# Patient Record
Sex: Male | Born: 1935 | Race: White | Hispanic: No | Marital: Married | Smoking: Never smoker
Health system: Southern US, Community
[De-identification: ages and names within clinical notes are randomized; demographics above are authoritative.]

## PROBLEM LIST (undated history)

## (undated) DIAGNOSIS — I219 Acute myocardial infarction, unspecified: Secondary | ICD-10-CM

## (undated) DIAGNOSIS — I82409 Acute embolism and thrombosis of unspecified deep veins of unspecified lower extremity: Secondary | ICD-10-CM

## (undated) DIAGNOSIS — I639 Cerebral infarction, unspecified: Secondary | ICD-10-CM

## (undated) DIAGNOSIS — K279 Peptic ulcer, site unspecified, unspecified as acute or chronic, without hemorrhage or perforation: Secondary | ICD-10-CM

## (undated) DIAGNOSIS — I10 Essential (primary) hypertension: Secondary | ICD-10-CM

## (undated) DIAGNOSIS — Z86711 Personal history of pulmonary embolism: Secondary | ICD-10-CM

## (undated) DIAGNOSIS — D649 Anemia, unspecified: Secondary | ICD-10-CM

## (undated) DIAGNOSIS — E785 Hyperlipidemia, unspecified: Secondary | ICD-10-CM

## (undated) DIAGNOSIS — I509 Heart failure, unspecified: Secondary | ICD-10-CM

## (undated) HISTORY — DX: Anemia, unspecified: D64.9

## (undated) HISTORY — DX: Personal history of pulmonary embolism: Z86.711

## (undated) HISTORY — DX: Acute embolism and thrombosis of unspecified deep veins of unspecified lower extremity: I82.409

## (undated) HISTORY — DX: Peptic ulcer, site unspecified, unspecified as acute or chronic, without hemorrhage or perforation: K27.9

## (undated) HISTORY — DX: Heart failure, unspecified: I50.9

## (undated) HISTORY — DX: Cerebral infarction, unspecified: I63.9

## (undated) HISTORY — DX: Acute myocardial infarction, unspecified: I21.9

## (undated) HISTORY — PX: GASTRECTOMY: SHX58

## (undated) HISTORY — PX: TRANSLUMINAL ANGIOPLASTY: SHX274

## (undated) HISTORY — DX: Hyperlipidemia, unspecified: E78.5

## (undated) HISTORY — PX: CATARACT EXTRACTION: SUR2

## (undated) HISTORY — DX: Essential (primary) hypertension: I10

## (undated) HISTORY — PX: NEPHRECTOMY: SHX65

## (undated) HISTORY — PX: APPENDECTOMY: SHX54

---

## 1998-12-15 ENCOUNTER — Ambulatory Visit (HOSPITAL_COMMUNITY): Admission: RE | Admit: 1998-12-15 | Discharge: 1998-12-15 | Payer: Self-pay | Admitting: Neurology

## 1998-12-15 ENCOUNTER — Encounter: Payer: Self-pay | Admitting: Neurology

## 2001-05-02 ENCOUNTER — Encounter: Payer: Self-pay | Admitting: Internal Medicine

## 2001-05-16 ENCOUNTER — Encounter: Payer: Self-pay | Admitting: Anesthesiology

## 2001-05-16 ENCOUNTER — Ambulatory Visit (HOSPITAL_COMMUNITY): Admission: RE | Admit: 2001-05-16 | Discharge: 2001-05-16 | Payer: Self-pay | Admitting: Anesthesiology

## 2001-12-15 ENCOUNTER — Inpatient Hospital Stay (HOSPITAL_COMMUNITY): Admission: EM | Admit: 2001-12-15 | Discharge: 2001-12-19 | Payer: Self-pay | Admitting: Emergency Medicine

## 2001-12-15 ENCOUNTER — Encounter: Payer: Self-pay | Admitting: Emergency Medicine

## 2002-01-01 ENCOUNTER — Encounter: Payer: Self-pay | Admitting: Internal Medicine

## 2002-01-01 ENCOUNTER — Encounter: Admission: RE | Admit: 2002-01-01 | Discharge: 2002-01-01 | Payer: Self-pay | Admitting: Internal Medicine

## 2002-06-23 ENCOUNTER — Encounter: Payer: Self-pay | Admitting: Neurosurgery

## 2002-06-23 ENCOUNTER — Ambulatory Visit (HOSPITAL_COMMUNITY): Admission: RE | Admit: 2002-06-23 | Discharge: 2002-06-23 | Payer: Self-pay | Admitting: Neurosurgery

## 2002-07-06 ENCOUNTER — Encounter: Payer: Self-pay | Admitting: Emergency Medicine

## 2002-07-06 ENCOUNTER — Inpatient Hospital Stay (HOSPITAL_COMMUNITY): Admission: EM | Admit: 2002-07-06 | Discharge: 2002-07-13 | Payer: Self-pay | Admitting: Emergency Medicine

## 2002-07-13 ENCOUNTER — Encounter: Payer: Self-pay | Admitting: Internal Medicine

## 2002-07-22 ENCOUNTER — Ambulatory Visit (HOSPITAL_COMMUNITY)
Admission: RE | Admit: 2002-07-22 | Discharge: 2002-07-22 | Payer: Self-pay | Admitting: Physical Medicine & Rehabilitation

## 2002-09-17 ENCOUNTER — Encounter: Payer: Self-pay | Admitting: Diagnostic Radiology

## 2002-09-17 ENCOUNTER — Encounter: Admission: RE | Admit: 2002-09-17 | Discharge: 2002-09-17 | Payer: Self-pay | Admitting: Neurosurgery

## 2002-09-17 ENCOUNTER — Encounter: Payer: Self-pay | Admitting: Neurosurgery

## 2002-11-08 ENCOUNTER — Encounter: Admission: RE | Admit: 2002-11-08 | Discharge: 2002-11-08 | Payer: Self-pay | Admitting: Neurosurgery

## 2002-11-08 ENCOUNTER — Encounter: Payer: Self-pay | Admitting: Neurosurgery

## 2003-03-21 ENCOUNTER — Encounter: Admission: RE | Admit: 2003-03-21 | Discharge: 2003-03-21 | Payer: Self-pay | Admitting: Neurosurgery

## 2003-04-17 ENCOUNTER — Encounter: Admission: RE | Admit: 2003-04-17 | Discharge: 2003-05-23 | Payer: Self-pay | Admitting: Internal Medicine

## 2003-06-04 ENCOUNTER — Emergency Department (HOSPITAL_COMMUNITY): Admission: EM | Admit: 2003-06-04 | Discharge: 2003-06-04 | Payer: Self-pay | Admitting: Emergency Medicine

## 2003-08-17 ENCOUNTER — Ambulatory Visit (HOSPITAL_COMMUNITY): Admission: RE | Admit: 2003-08-17 | Discharge: 2003-08-17 | Payer: Self-pay | Admitting: Neurosurgery

## 2003-09-11 ENCOUNTER — Inpatient Hospital Stay (HOSPITAL_COMMUNITY): Admission: RE | Admit: 2003-09-11 | Discharge: 2003-09-19 | Payer: Self-pay | Admitting: Neurosurgery

## 2004-02-11 ENCOUNTER — Ambulatory Visit: Payer: Self-pay | Admitting: Internal Medicine

## 2004-03-10 ENCOUNTER — Ambulatory Visit: Payer: Self-pay | Admitting: Internal Medicine

## 2004-03-17 ENCOUNTER — Ambulatory Visit: Payer: Self-pay | Admitting: Internal Medicine

## 2004-04-08 ENCOUNTER — Ambulatory Visit: Payer: Self-pay | Admitting: Internal Medicine

## 2004-04-14 ENCOUNTER — Ambulatory Visit: Payer: Self-pay | Admitting: Internal Medicine

## 2004-04-20 ENCOUNTER — Ambulatory Visit: Payer: Self-pay

## 2004-04-21 ENCOUNTER — Ambulatory Visit: Payer: Self-pay | Admitting: Internal Medicine

## 2004-04-26 ENCOUNTER — Ambulatory Visit: Payer: Self-pay | Admitting: Internal Medicine

## 2004-04-28 ENCOUNTER — Ambulatory Visit: Payer: Self-pay | Admitting: Internal Medicine

## 2004-05-03 ENCOUNTER — Ambulatory Visit: Payer: Self-pay | Admitting: Internal Medicine

## 2004-05-31 ENCOUNTER — Ambulatory Visit: Payer: Self-pay | Admitting: Internal Medicine

## 2004-06-09 ENCOUNTER — Ambulatory Visit: Payer: Self-pay | Admitting: Cardiology

## 2004-06-10 IMAGING — CR DG SPINE 1V PORT
1 series · 1 of 1 positions shown · non-contrast
Comparison: none

CLINICAL DATA: Lumbar spondylosis and radiculopathy.  
 PORTABLE LUMBAR SPINE (TWO VIEWS)
 Intraoperative cross-table lateral radiograph number one shows posterior retractors with a probe overlying the posterior elements at the level of the L1-2 intervertebral disc space. 
 Intraoperative cross-table lateral radiograph number two shows probes overlying the posterior elements at the levels of the L1-2 and L2-3 intervertebral disc spaces. 
 IMPRESSION
 Intraoperative localization of levels of L1-2 and L2-3.

[view not recorded]
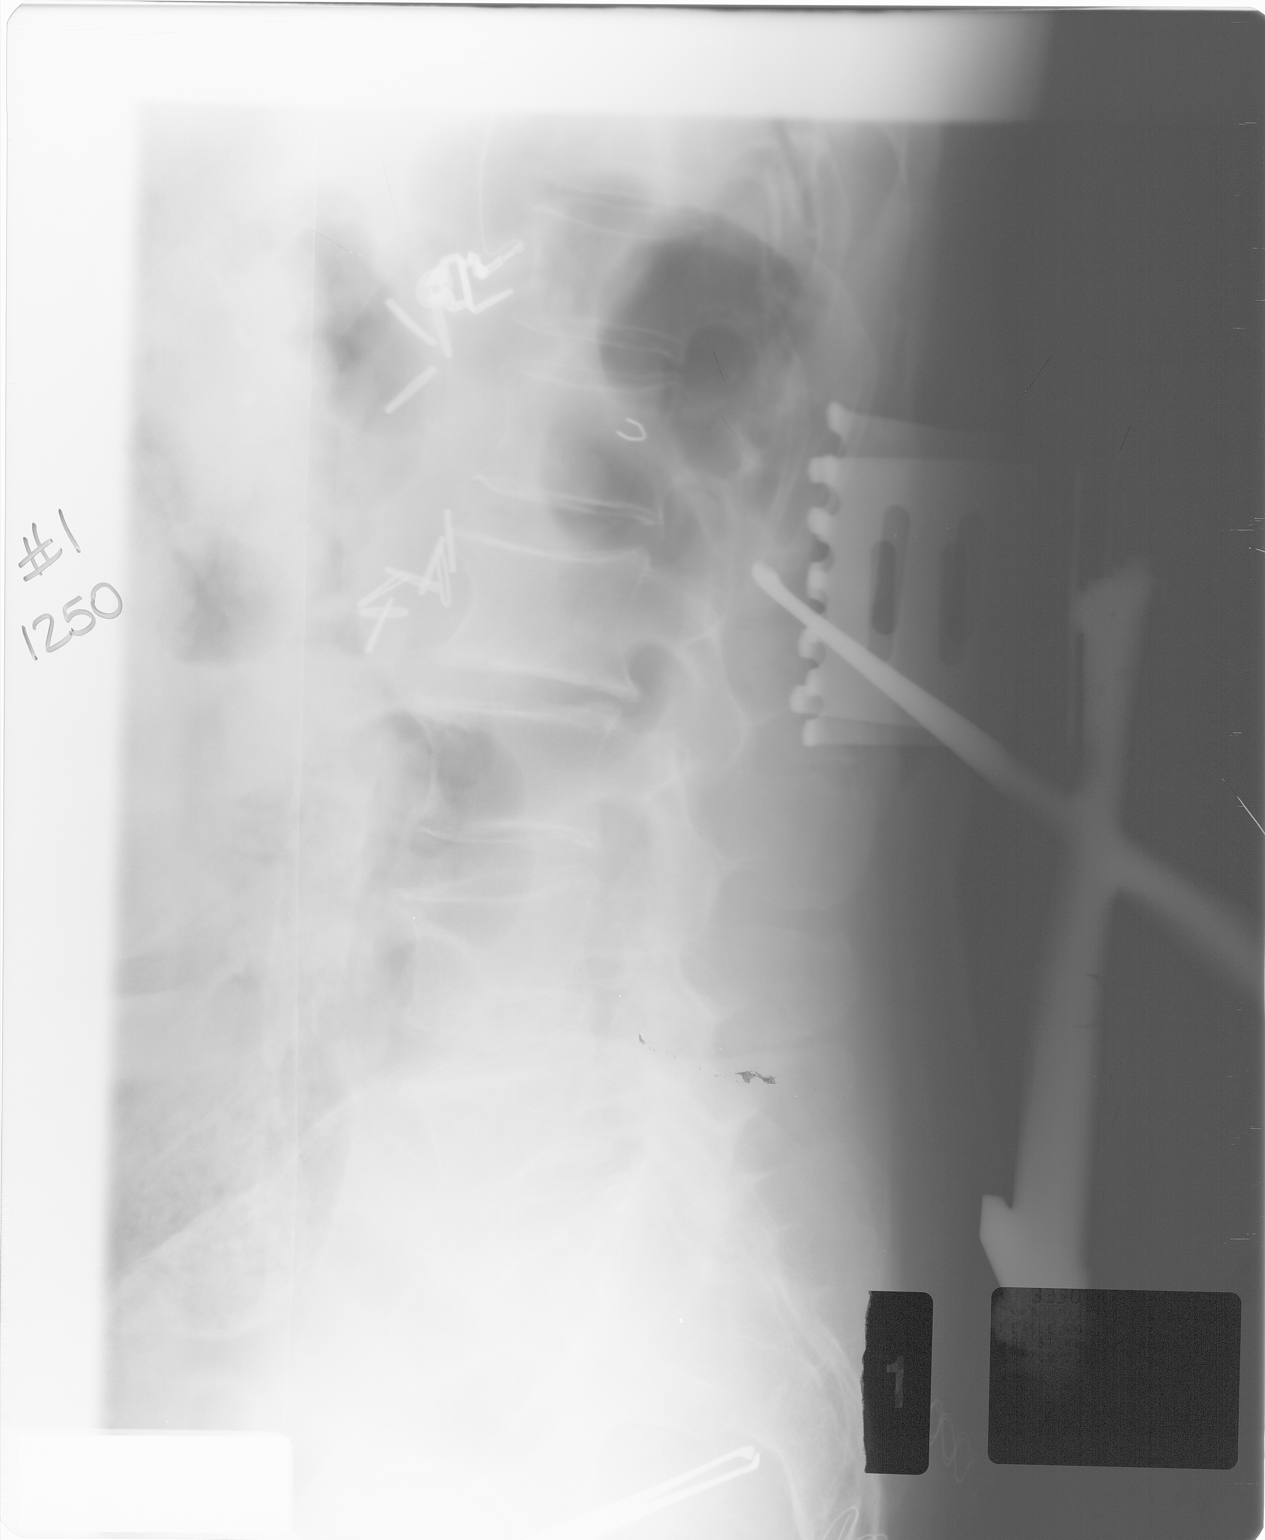

[1 of 1 positions shown; findings below may reference images not displayed]

## 2004-06-14 ENCOUNTER — Ambulatory Visit: Payer: Self-pay | Admitting: Internal Medicine

## 2004-06-17 ENCOUNTER — Ambulatory Visit: Payer: Self-pay

## 2004-06-25 ENCOUNTER — Ambulatory Visit: Payer: Self-pay | Admitting: Internal Medicine

## 2004-07-28 ENCOUNTER — Ambulatory Visit: Payer: Self-pay | Admitting: Internal Medicine

## 2004-07-29 ENCOUNTER — Ambulatory Visit: Payer: Self-pay | Admitting: Internal Medicine

## 2004-08-27 ENCOUNTER — Ambulatory Visit: Payer: Self-pay | Admitting: Internal Medicine

## 2004-10-01 ENCOUNTER — Ambulatory Visit: Payer: Self-pay | Admitting: Internal Medicine

## 2004-10-18 ENCOUNTER — Ambulatory Visit: Payer: Self-pay | Admitting: Internal Medicine

## 2004-11-17 ENCOUNTER — Ambulatory Visit: Payer: Self-pay | Admitting: Internal Medicine

## 2004-12-17 ENCOUNTER — Ambulatory Visit: Payer: Self-pay | Admitting: Internal Medicine

## 2005-01-17 ENCOUNTER — Ambulatory Visit: Payer: Self-pay | Admitting: Internal Medicine

## 2005-02-15 ENCOUNTER — Ambulatory Visit: Payer: Self-pay | Admitting: Internal Medicine

## 2005-03-14 ENCOUNTER — Ambulatory Visit (HOSPITAL_COMMUNITY): Admission: RE | Admit: 2005-03-14 | Discharge: 2005-03-14 | Payer: Self-pay | Admitting: Neurosurgery

## 2005-03-18 ENCOUNTER — Ambulatory Visit: Payer: Self-pay | Admitting: Internal Medicine

## 2005-03-21 ENCOUNTER — Ambulatory Visit (HOSPITAL_COMMUNITY): Admission: RE | Admit: 2005-03-21 | Discharge: 2005-03-21 | Payer: Self-pay | Admitting: Neurosurgery

## 2005-04-01 ENCOUNTER — Ambulatory Visit: Payer: Self-pay | Admitting: Internal Medicine

## 2005-05-03 ENCOUNTER — Ambulatory Visit: Payer: Self-pay | Admitting: Internal Medicine

## 2005-05-25 ENCOUNTER — Ambulatory Visit: Payer: Self-pay | Admitting: Internal Medicine

## 2005-06-21 ENCOUNTER — Ambulatory Visit: Payer: Self-pay | Admitting: Internal Medicine

## 2005-06-30 ENCOUNTER — Ambulatory Visit: Payer: Self-pay | Admitting: Cardiology

## 2005-07-26 ENCOUNTER — Ambulatory Visit: Payer: Self-pay | Admitting: Internal Medicine

## 2005-08-23 ENCOUNTER — Ambulatory Visit: Payer: Self-pay | Admitting: Internal Medicine

## 2005-09-23 ENCOUNTER — Ambulatory Visit: Payer: Self-pay | Admitting: Internal Medicine

## 2005-09-30 ENCOUNTER — Ambulatory Visit: Payer: Self-pay | Admitting: Internal Medicine

## 2005-10-14 ENCOUNTER — Ambulatory Visit: Payer: Self-pay | Admitting: Internal Medicine

## 2005-10-24 ENCOUNTER — Ambulatory Visit: Payer: Self-pay | Admitting: Internal Medicine

## 2005-11-24 ENCOUNTER — Ambulatory Visit: Payer: Self-pay | Admitting: Internal Medicine

## 2005-11-29 ENCOUNTER — Ambulatory Visit: Payer: Self-pay | Admitting: Internal Medicine

## 2005-12-30 ENCOUNTER — Ambulatory Visit: Payer: Self-pay | Admitting: Internal Medicine

## 2006-01-26 ENCOUNTER — Ambulatory Visit: Payer: Self-pay | Admitting: Internal Medicine

## 2006-02-24 ENCOUNTER — Ambulatory Visit: Payer: Self-pay | Admitting: Internal Medicine

## 2006-03-27 ENCOUNTER — Ambulatory Visit: Payer: Self-pay | Admitting: Internal Medicine

## 2006-04-21 ENCOUNTER — Ambulatory Visit: Payer: Self-pay | Admitting: Internal Medicine

## 2006-04-21 LAB — CONVERTED CEMR LAB
ALT: 20 units/L (ref 0–40)
AST: 22 units/L (ref 0–37)
Albumin: 3.7 g/dL (ref 3.5–5.2)
Alkaline Phosphatase: 88 units/L (ref 39–117)
BUN: 25 mg/dL — ABNORMAL HIGH (ref 6–23)
Basophils Absolute: 0 10*3/uL (ref 0.0–0.1)
Basophils Relative: 0.1 % (ref 0.0–1.0)
Bilirubin, Direct: 0.2 mg/dL (ref 0.0–0.3)
CO2: 26 meq/L (ref 19–32)
Calcium: 9.8 mg/dL (ref 8.4–10.5)
Chloride: 107 meq/L (ref 96–112)
Cholesterol: 142 mg/dL (ref 0–200)
Creatinine, Ser: 1.3 mg/dL (ref 0.4–1.5)
Eosinophils Relative: 1.3 % (ref 0.0–5.0)
GFR calc Af Amer: 67 mL/min
GFR calc non Af Amer: 56 mL/min
Glucose, Bld: 92 mg/dL (ref 70–99)
HCT: 34.6 % — ABNORMAL LOW (ref 39.0–52.0)
HDL: 37.1 mg/dL — ABNORMAL LOW (ref >39.0)
Hemoglobin: 10.9 g/dL — ABNORMAL LOW (ref 13.0–17.0)
LDL Cholesterol: 82 mg/dL (ref 0–99)
Lymphocytes Relative: 13.8 % (ref 12.0–46.0)
MCHC: 31.4 g/dL (ref 30.0–36.0)
MCV: 86.3 fL (ref 78.0–100.0)
Monocytes Absolute: 0.7 10*3/uL (ref 0.2–0.7)
Monocytes Relative: 10.6 % (ref 3.0–11.0)
Neutro Abs: 5 10*3/uL (ref 1.4–7.7)
Neutrophils Relative %: 74.2 % (ref 43.0–77.0)
Platelets: 302 10*3/uL (ref 150–400)
Potassium: 4.8 meq/L (ref 3.5–5.1)
RBC: 4.01 M/uL — ABNORMAL LOW (ref 4.22–5.81)
RDW: 17.5 % — ABNORMAL HIGH (ref 11.5–14.6)
Sodium: 139 meq/L (ref 135–145)
Total Bilirubin: 0.9 mg/dL (ref 0.3–1.2)
Total CHOL/HDL Ratio: 3.8
Total Protein: 6.6 g/dL (ref 6.0–8.3)
Triglycerides: 113 mg/dL (ref 0–149)
VLDL: 23 mg/dL (ref 0–40)
WBC: 6.7 10*3/uL (ref 4.5–10.5)

## 2006-04-26 ENCOUNTER — Ambulatory Visit: Payer: Self-pay | Admitting: Internal Medicine

## 2006-04-26 ENCOUNTER — Encounter: Admission: RE | Admit: 2006-04-26 | Discharge: 2006-05-15 | Payer: Self-pay | Admitting: Internal Medicine

## 2006-04-26 LAB — CONVERTED CEMR LAB
Basophils Absolute: 0 10*3/uL (ref 0.0–0.1)
Basophils Relative: 0.3 % (ref 0.0–1.0)
Eosinophils Absolute: 0.1 10*3/uL (ref 0.0–0.6)
Eosinophils Relative: 1.8 % (ref 0.0–5.0)
Ferritin: 7.8 ng/mL — ABNORMAL LOW (ref 22.0–322.0)
Folate: 13.4 ng/mL
HCT: 32.7 % — ABNORMAL LOW (ref 39.0–52.0)
Hemoglobin: 10.5 g/dL — ABNORMAL LOW (ref 13.0–17.0)
Iron: 33 ug/dL — ABNORMAL LOW (ref 42–165)
Lymphocytes Relative: 20.9 % (ref 12.0–46.0)
MCHC: 32.2 g/dL (ref 30.0–36.0)
MCV: 84.9 fL (ref 78.0–100.0)
Monocytes Absolute: 0.5 10*3/uL (ref 0.2–0.7)
Monocytes Relative: 8.4 % (ref 3.0–11.0)
Neutro Abs: 4.4 10*3/uL (ref 1.4–7.7)
Neutrophils Relative %: 68.6 % (ref 43.0–77.0)
Platelets: 221 10*3/uL (ref 150–400)
RBC: 3.85 M/uL — ABNORMAL LOW (ref 4.22–5.81)
RDW: 17.8 % — ABNORMAL HIGH (ref 11.5–14.6)
Saturation Ratios: 7.8 % — ABNORMAL LOW (ref 20.0–50.0)
Transferrin: 301.6 mg/dL (ref >=212.0)
Vitamin B-12: 407 pg/mL (ref 211–911)
WBC: 6.3 10*3/uL (ref 4.5–10.5)

## 2006-05-17 ENCOUNTER — Ambulatory Visit: Payer: Self-pay | Admitting: Gastroenterology

## 2006-05-19 ENCOUNTER — Ambulatory Visit: Payer: Self-pay | Admitting: Gastroenterology

## 2006-05-22 ENCOUNTER — Ambulatory Visit: Payer: Self-pay | Admitting: Internal Medicine

## 2006-05-25 ENCOUNTER — Ambulatory Visit: Payer: Self-pay | Admitting: Gastroenterology

## 2006-06-09 ENCOUNTER — Ambulatory Visit: Payer: Self-pay | Admitting: Gastroenterology

## 2006-06-13 ENCOUNTER — Ambulatory Visit: Payer: Self-pay | Admitting: Internal Medicine

## 2006-06-20 ENCOUNTER — Ambulatory Visit: Payer: Self-pay | Admitting: Internal Medicine

## 2006-06-26 ENCOUNTER — Ambulatory Visit: Payer: Self-pay | Admitting: Cardiology

## 2006-07-21 ENCOUNTER — Ambulatory Visit: Payer: Self-pay | Admitting: Internal Medicine

## 2006-07-21 LAB — CONVERTED CEMR LAB
ALT: 16 units/L (ref 0–40)
AST: 18 units/L (ref 0–37)
Albumin: 3.5 g/dL (ref 3.5–5.2)
Alkaline Phosphatase: 71 units/L (ref 39–117)
BUN: 23 mg/dL (ref 6–23)
Basophils Absolute: 0 10*3/uL (ref 0.0–0.1)
Basophils Relative: 0.2 % (ref 0.0–1.0)
Bilirubin, Direct: 0.1 mg/dL (ref 0.0–0.3)
CO2: 26 meq/L (ref 19–32)
Calcium: 9.3 mg/dL (ref 8.4–10.5)
Chloride: 111 meq/L (ref 96–112)
Cholesterol: 125 mg/dL (ref 0–200)
Creatinine, Ser: 1.3 mg/dL (ref 0.4–1.5)
Eosinophils Absolute: 0.2 10*3/uL (ref 0.0–0.6)
Eosinophils Relative: 3.5 % (ref 0.0–5.0)
Ferritin: 5.2 ng/mL — ABNORMAL LOW (ref 22.0–322.0)
GFR calc Af Amer: 67 mL/min
GFR calc non Af Amer: 56 mL/min
Glucose, Bld: 88 mg/dL (ref 70–99)
HCT: 23.8 % — CL (ref 39.0–52.0)
HDL: 40.9 mg/dL (ref >39.0)
Hemoglobin: 7.6 g/dL — CL (ref 13.0–17.0)
Iron: 16 ug/dL — ABNORMAL LOW (ref 42–165)
LDL Cholesterol: 67 mg/dL (ref 0–99)
Lymphocytes Relative: 18.4 % (ref 12.0–46.0)
MCHC: 31.9 g/dL (ref 30.0–36.0)
MCV: 82.2 fL (ref 78.0–100.0)
Monocytes Absolute: 0.6 10*3/uL (ref 0.2–0.7)
Monocytes Relative: 10.6 % (ref 3.0–11.0)
Neutro Abs: 3.5 10*3/uL (ref 1.4–7.7)
Neutrophils Relative %: 67.3 % (ref 43.0–77.0)
Platelets: 162 10*3/uL (ref 150–400)
Potassium: 5 meq/L (ref 3.5–5.1)
RBC: 2.9 M/uL — ABNORMAL LOW (ref 4.22–5.81)
RDW: 19.4 % — ABNORMAL HIGH (ref 11.5–14.6)
Saturation Ratios: 4.1 % — ABNORMAL LOW (ref 20.0–50.0)
Sodium: 143 meq/L (ref 135–145)
Total Bilirubin: 0.7 mg/dL (ref 0.3–1.2)
Total CHOL/HDL Ratio: 3.1
Total Protein: 6.2 g/dL (ref 6.0–8.3)
Transferrin: 276.3 mg/dL (ref >=212.0)
Triglycerides: 88 mg/dL (ref 0–149)
VLDL: 18 mg/dL (ref 0–40)
WBC: 5.3 10*3/uL (ref 4.5–10.5)

## 2006-07-24 ENCOUNTER — Encounter (HOSPITAL_COMMUNITY): Admission: RE | Admit: 2006-07-24 | Discharge: 2006-10-05 | Payer: Self-pay | Admitting: Internal Medicine

## 2006-08-17 ENCOUNTER — Ambulatory Visit: Payer: Self-pay | Admitting: Internal Medicine

## 2006-08-24 ENCOUNTER — Ambulatory Visit: Payer: Self-pay | Admitting: Internal Medicine

## 2006-08-24 DIAGNOSIS — I10 Essential (primary) hypertension: Secondary | ICD-10-CM | POA: Insufficient documentation

## 2006-08-24 DIAGNOSIS — E785 Hyperlipidemia, unspecified: Secondary | ICD-10-CM | POA: Insufficient documentation

## 2006-08-24 DIAGNOSIS — I252 Old myocardial infarction: Secondary | ICD-10-CM | POA: Insufficient documentation

## 2006-08-24 DIAGNOSIS — I2510 Atherosclerotic heart disease of native coronary artery without angina pectoris: Secondary | ICD-10-CM | POA: Insufficient documentation

## 2006-08-24 DIAGNOSIS — K279 Peptic ulcer, site unspecified, unspecified as acute or chronic, without hemorrhage or perforation: Secondary | ICD-10-CM | POA: Insufficient documentation

## 2006-08-24 LAB — CONVERTED CEMR LAB
Basophils Absolute: 0 10*3/uL (ref 0.0–0.1)
Basophils Relative: 0.2 % (ref 0.0–1.0)
Eosinophils Absolute: 0.2 10*3/uL (ref 0.0–0.6)
Eosinophils Relative: 2.2 % (ref 0.0–5.0)
Ferritin: 6.1 ng/mL — ABNORMAL LOW (ref 22.0–322.0)
HCT: 30.2 % — ABNORMAL LOW (ref 39.0–52.0)
Hemoglobin: 9.8 g/dL — ABNORMAL LOW (ref 13.0–17.0)
Iron: 23 ug/dL — ABNORMAL LOW (ref 42–165)
Lymphocytes Relative: 15.1 % (ref 12.0–46.0)
MCHC: 32.4 g/dL (ref 30.0–36.0)
MCV: 80.2 fL (ref 78.0–100.0)
Monocytes Absolute: 0.7 10*3/uL (ref 0.2–0.7)
Monocytes Relative: 9.8 % (ref 3.0–11.0)
Neutro Abs: 5 10*3/uL (ref 1.4–7.7)
Neutrophils Relative %: 72.7 % (ref 43.0–77.0)
Platelets: 169 10*3/uL (ref 150–400)
RBC: 3.77 M/uL — ABNORMAL LOW (ref 4.22–5.81)
RDW: 19.6 % — ABNORMAL HIGH (ref 11.5–14.6)
Saturation Ratios: 5.3 % — ABNORMAL LOW (ref 20.0–50.0)
Transferrin: 312.8 mg/dL (ref >=212.0)
WBC: 7 10*3/uL (ref 4.5–10.5)

## 2006-09-18 ENCOUNTER — Ambulatory Visit: Payer: Self-pay | Admitting: Internal Medicine

## 2006-10-03 ENCOUNTER — Ambulatory Visit: Payer: Self-pay | Admitting: Internal Medicine

## 2006-10-09 ENCOUNTER — Ambulatory Visit: Payer: Self-pay | Admitting: Internal Medicine

## 2006-10-10 ENCOUNTER — Ambulatory Visit: Payer: Self-pay | Admitting: Internal Medicine

## 2006-10-11 ENCOUNTER — Ambulatory Visit: Payer: Self-pay | Admitting: Internal Medicine

## 2006-10-11 LAB — CONVERTED CEMR LAB
ALT: 23 units/L (ref 0–53)
AST: 24 units/L (ref 0–37)
Albumin: 3.6 g/dL (ref 3.5–5.2)
Alkaline Phosphatase: 70 units/L (ref 39–117)
BUN: 19 mg/dL (ref 6–23)
Basophils Absolute: 0 10*3/uL (ref 0.0–0.1)
Basophils Relative: 0 % (ref 0–1)
Bilirubin, Direct: 0.1 mg/dL (ref 0.0–0.3)
CO2: 27 meq/L (ref 19–32)
Calcium: 9.9 mg/dL (ref 8.4–10.5)
Chloride: 112 meq/L (ref 96–112)
Cholesterol: 141 mg/dL (ref 0–200)
Creatinine, Ser: 1 mg/dL (ref 0.4–1.5)
Eosinophils Absolute: 0.1 10*3/uL (ref 0.0–0.7)
Eosinophils Relative: 2 % (ref 0–5)
Ferritin: 32.7 ng/mL (ref 22.0–322.0)
GFR calc Af Amer: 91 mL/min
GFR calc non Af Amer: 75 mL/min
Glucose, Bld: 94 mg/dL (ref 70–99)
HCT: 43.7 % (ref 39.0–52.0)
HDL: 41.7 mg/dL (ref >39.0)
Hemoglobin: 13.5 g/dL (ref 13.0–17.0)
Iron: 99 ug/dL (ref 42–165)
LDL Cholesterol: 72 mg/dL (ref 0–99)
Lymphocytes Relative: 15 % (ref 12–46)
Lymphs Abs: 1.1 10*3/uL (ref 0.7–3.3)
MCHC: 30.9 g/dL (ref 30.0–36.0)
MCV: 100.5 fL — ABNORMAL HIGH (ref 78.0–100.0)
Monocytes Absolute: 0.7 10*3/uL (ref 0.2–0.7)
Monocytes Relative: 10 % (ref 3–11)
Neutro Abs: 5.1 10*3/uL (ref 1.7–7.7)
Neutrophils Relative %: 73 % (ref 43–77)
Platelets: 126 10*3/uL — ABNORMAL LOW (ref 150–400)
Potassium: 4.9 meq/L (ref 3.5–5.1)
RBC: 4.35 M/uL (ref 4.22–5.81)
RDW: 23.7 % — ABNORMAL HIGH (ref 11.5–14.0)
Saturation Ratios: 30.5 % (ref 20.0–50.0)
Sodium: 140 meq/L (ref 135–145)
Total Bilirubin: 0.8 mg/dL (ref 0.3–1.2)
Total CHOL/HDL Ratio: 3.4
Total Protein: 6.3 g/dL (ref 6.0–8.3)
Transferrin: 231.9 mg/dL (ref >=212.0)
Triglycerides: 135 mg/dL (ref 0–149)
VLDL: 27 mg/dL (ref 0–40)
WBC: 6.9 10*3/uL (ref 4.0–10.5)

## 2006-10-23 ENCOUNTER — Ambulatory Visit: Payer: Self-pay | Admitting: Internal Medicine

## 2006-10-23 LAB — CONVERTED CEMR LAB: INR: 1.9

## 2006-11-23 ENCOUNTER — Ambulatory Visit: Payer: Self-pay | Admitting: Internal Medicine

## 2006-11-23 LAB — CONVERTED CEMR LAB
INR: 2
Prothrombin Time: 17.5 s

## 2006-12-25 ENCOUNTER — Ambulatory Visit: Payer: Self-pay | Admitting: Internal Medicine

## 2006-12-25 LAB — CONVERTED CEMR LAB
INR: 3.1
Prothrombin Time: 21.1 s

## 2007-01-03 ENCOUNTER — Ambulatory Visit: Payer: Self-pay | Admitting: Cardiology

## 2007-01-03 LAB — CONVERTED CEMR LAB
BUN: 23 mg/dL (ref 6–23)
CO2: 27 meq/L (ref 19–32)
Calcium: 9.8 mg/dL (ref 8.4–10.5)
Chloride: 108 meq/L (ref 96–112)
Creatinine, Ser: 1.2 mg/dL (ref 0.4–1.5)
GFR calc Af Amer: 74 mL/min
GFR calc non Af Amer: 61 mL/min
Glucose, Bld: 124 mg/dL — ABNORMAL HIGH (ref 70–99)
Potassium: 4.3 meq/L (ref 3.5–5.1)
Pro B Natriuretic peptide (BNP): 260 pg/mL — ABNORMAL HIGH (ref 0.0–100.0)
Sodium: 141 meq/L (ref 135–145)

## 2007-01-09 ENCOUNTER — Ambulatory Visit: Payer: Self-pay

## 2007-01-12 ENCOUNTER — Ambulatory Visit: Payer: Self-pay | Admitting: Internal Medicine

## 2007-01-12 DIAGNOSIS — D649 Anemia, unspecified: Secondary | ICD-10-CM | POA: Insufficient documentation

## 2007-01-16 LAB — CONVERTED CEMR LAB
Basophils Absolute: 0 10*3/uL (ref 0.0–0.1)
Basophils Relative: 0.3 % (ref 0.0–1.0)
Eosinophils Absolute: 0.1 10*3/uL (ref 0.0–0.6)
Eosinophils Relative: 1.6 % (ref 0.0–5.0)
Ferritin: 66.9 ng/mL (ref 22.0–322.0)
HCT: 41.7 % (ref 39.0–52.0)
Hemoglobin: 14.1 g/dL (ref 13.0–17.0)
Iron: 203 ug/dL — ABNORMAL HIGH (ref 42–165)
Lymphocytes Relative: 17.2 % (ref 12.0–46.0)
MCHC: 33.8 g/dL (ref 30.0–36.0)
MCV: 106.6 fL — ABNORMAL HIGH (ref 78.0–100.0)
Monocytes Absolute: 0.5 10*3/uL (ref 0.2–0.7)
Monocytes Relative: 8.3 % (ref 3.0–11.0)
Neutro Abs: 4.7 10*3/uL (ref 1.4–7.7)
Neutrophils Relative %: 72.6 % (ref 43.0–77.0)
Platelets: 133 10*3/uL — ABNORMAL LOW (ref 150–400)
RBC: 3.91 M/uL — ABNORMAL LOW (ref 4.22–5.81)
RDW: 14.6 % (ref 11.5–14.6)
Transferrin: 216.1 mg/dL (ref >=212.0)
WBC: 6.4 10*3/uL (ref 4.5–10.5)

## 2007-01-25 ENCOUNTER — Ambulatory Visit: Payer: Self-pay | Admitting: Cardiology

## 2007-01-25 LAB — CONVERTED CEMR LAB
BUN: 33 mg/dL — ABNORMAL HIGH (ref 6–23)
CO2: 29 meq/L (ref 19–32)
Calcium: 9.7 mg/dL (ref 8.4–10.5)
Chloride: 109 meq/L (ref 96–112)
Creatinine, Ser: 1.2 mg/dL (ref 0.4–1.5)
GFR calc Af Amer: 74 mL/min
GFR calc non Af Amer: 61 mL/min
Glucose, Bld: 85 mg/dL (ref 70–99)
INR: 3.4 — ABNORMAL HIGH (ref 0.8–1.0)
Potassium: 4.3 meq/L (ref 3.5–5.1)
Pro B Natriuretic peptide (BNP): 82 pg/mL (ref 0.0–100.0)
Prothrombin Time: 23.4 s — ABNORMAL HIGH (ref 10.9–13.3)
Sodium: 142 meq/L (ref 135–145)

## 2007-02-20 ENCOUNTER — Ambulatory Visit: Payer: Self-pay | Admitting: Cardiology

## 2007-03-06 ENCOUNTER — Ambulatory Visit: Payer: Self-pay | Admitting: Internal Medicine

## 2007-03-06 LAB — CONVERTED CEMR LAB
INR: 2.8
Prothrombin Time: 20.4 s

## 2007-03-23 ENCOUNTER — Emergency Department (HOSPITAL_COMMUNITY): Admission: EM | Admit: 2007-03-23 | Discharge: 2007-03-23 | Payer: Self-pay | Admitting: Emergency Medicine

## 2007-04-09 ENCOUNTER — Ambulatory Visit: Payer: Self-pay | Admitting: Internal Medicine

## 2007-04-09 LAB — CONVERTED CEMR LAB: INR: 3.2

## 2007-04-13 ENCOUNTER — Ambulatory Visit: Payer: Self-pay | Admitting: Internal Medicine

## 2007-04-13 LAB — CONVERTED CEMR LAB
Alkaline Phosphatase: 91 units/L (ref 39–117)
BUN: 28 mg/dL — ABNORMAL HIGH (ref 6–23)
Basophils Relative: 0.1 % (ref 0.0–1.0)
Bilirubin, Direct: 0.1 mg/dL (ref 0.0–0.3)
CO2: 29 meq/L (ref 19–32)
Cholesterol: 159 mg/dL (ref 0–200)
Creatinine, Ser: 1.2 mg/dL (ref 0.4–1.5)
GFR calc Af Amer: 74 mL/min
Glucose, Bld: 99 mg/dL (ref 70–99)
HCT: 44.8 % (ref 39.0–52.0)
HDL: 41 mg/dL (ref >39.0)
Hemoglobin: 15.1 g/dL (ref 13.0–17.0)
Lymphocytes Relative: 17.7 % (ref 12.0–46.0)
Monocytes Absolute: 0.6 10*3/uL (ref 0.2–0.7)
Monocytes Relative: 7.7 % (ref 3.0–11.0)
Neutro Abs: 5.4 10*3/uL (ref 1.4–7.7)
Neutrophils Relative %: 72.5 % (ref 43.0–77.0)
Potassium: 4.9 meq/L (ref 3.5–5.1)
RDW: 14.2 % (ref 11.5–14.6)
Sodium: 140 meq/L (ref 135–145)
Total Bilirubin: 1.1 mg/dL (ref 0.3–1.2)
Total Protein: 6.8 g/dL (ref 6.0–8.3)
VLDL: 27 mg/dL (ref 0–40)

## 2007-05-10 ENCOUNTER — Ambulatory Visit: Payer: Self-pay | Admitting: Internal Medicine

## 2007-05-10 LAB — CONVERTED CEMR LAB
INR: 4.1
Prothrombin Time: 24.6 s

## 2007-06-07 ENCOUNTER — Ambulatory Visit: Payer: Self-pay | Admitting: Internal Medicine

## 2007-06-07 LAB — CONVERTED CEMR LAB: INR: 4

## 2007-06-19 ENCOUNTER — Ambulatory Visit: Payer: Self-pay | Admitting: Cardiology

## 2007-07-03 ENCOUNTER — Encounter: Payer: Self-pay | Admitting: Cardiology

## 2007-07-03 ENCOUNTER — Ambulatory Visit: Payer: Self-pay | Admitting: Cardiology

## 2007-07-03 ENCOUNTER — Ambulatory Visit: Payer: Self-pay

## 2007-07-03 LAB — CONVERTED CEMR LAB
Basophils Absolute: 0 10*3/uL (ref 0.0–0.1)
Basophils Relative: 0.2 % (ref 0.0–1.0)
CO2: 29 meq/L (ref 19–32)
Chloride: 105 meq/L (ref 96–112)
Creatinine, Ser: 1.5 mg/dL (ref 0.4–1.5)
Eosinophils Absolute: 0.3 10*3/uL (ref 0.0–0.7)
GFR calc non Af Amer: 47 mL/min
Lymphocytes Relative: 17.5 % (ref 12.0–46.0)
MCHC: 32.5 g/dL (ref 30.0–36.0)
MCV: 107.8 fL — ABNORMAL HIGH (ref 78.0–100.0)
Neutrophils Relative %: 67.5 % (ref 43.0–77.0)
Platelets: 113 10*3/uL — ABNORMAL LOW (ref 150–400)
Pro B Natriuretic peptide (BNP): 48 pg/mL (ref 0.0–100.0)
RBC: 4.01 M/uL — ABNORMAL LOW (ref 4.22–5.81)
RDW: 14.7 % — ABNORMAL HIGH (ref 11.5–14.6)
Sodium: 139 meq/L (ref 135–145)

## 2007-07-09 ENCOUNTER — Ambulatory Visit: Payer: Self-pay | Admitting: Internal Medicine

## 2007-07-09 LAB — CONVERTED CEMR LAB: INR: 3.1

## 2007-07-24 ENCOUNTER — Ambulatory Visit (HOSPITAL_COMMUNITY): Admission: RE | Admit: 2007-07-24 | Discharge: 2007-07-24 | Payer: Self-pay | Admitting: Cardiology

## 2007-07-24 ENCOUNTER — Ambulatory Visit: Payer: Self-pay | Admitting: Cardiovascular Disease

## 2007-08-07 ENCOUNTER — Ambulatory Visit: Payer: Self-pay | Admitting: Internal Medicine

## 2007-08-07 LAB — CONVERTED CEMR LAB: INR: 1.9

## 2007-08-09 ENCOUNTER — Ambulatory Visit: Payer: Self-pay | Admitting: Vascular Surgery

## 2007-08-09 ENCOUNTER — Encounter (INDEPENDENT_AMBULATORY_CARE_PROVIDER_SITE_OTHER): Payer: Self-pay | Admitting: Emergency Medicine

## 2007-08-09 ENCOUNTER — Emergency Department (HOSPITAL_COMMUNITY): Admission: EM | Admit: 2007-08-09 | Discharge: 2007-08-09 | Payer: Self-pay | Admitting: Emergency Medicine

## 2007-08-15 ENCOUNTER — Telehealth: Payer: Self-pay | Admitting: Internal Medicine

## 2007-08-15 ENCOUNTER — Ambulatory Visit: Payer: Self-pay | Admitting: Internal Medicine

## 2007-09-04 ENCOUNTER — Ambulatory Visit: Payer: Self-pay | Admitting: Internal Medicine

## 2007-10-02 ENCOUNTER — Ambulatory Visit: Payer: Self-pay | Admitting: Internal Medicine

## 2007-10-02 LAB — CONVERTED CEMR LAB
INR: 2
Prothrombin Time: 17.4 s

## 2007-10-18 ENCOUNTER — Ambulatory Visit: Payer: Self-pay | Admitting: Cardiology

## 2007-10-18 LAB — CONVERTED CEMR LAB
CO2: 26 meq/L (ref 19–32)
Chloride: 102 meq/L (ref 96–112)
GFR calc Af Amer: 57 mL/min
Glucose, Bld: 96 mg/dL (ref 70–99)
Potassium: 4.5 meq/L (ref 3.5–5.1)
Sodium: 135 meq/L (ref 135–145)

## 2007-10-30 ENCOUNTER — Ambulatory Visit: Payer: Self-pay | Admitting: Internal Medicine

## 2007-10-30 LAB — CONVERTED CEMR LAB: Prothrombin Time: 18.8 s

## 2007-11-06 ENCOUNTER — Ambulatory Visit: Payer: Self-pay | Admitting: Internal Medicine

## 2007-11-28 ENCOUNTER — Ambulatory Visit: Payer: Self-pay | Admitting: Internal Medicine

## 2007-11-28 LAB — CONVERTED CEMR LAB: INR: 5.6

## 2007-12-12 ENCOUNTER — Ambulatory Visit: Payer: Self-pay | Admitting: Internal Medicine

## 2007-12-12 LAB — CONVERTED CEMR LAB: Prothrombin Time: 17.5 s

## 2007-12-20 ENCOUNTER — Encounter: Payer: Self-pay | Admitting: Internal Medicine

## 2008-01-11 ENCOUNTER — Ambulatory Visit: Payer: Self-pay | Admitting: Internal Medicine

## 2008-01-11 DIAGNOSIS — M549 Dorsalgia, unspecified: Secondary | ICD-10-CM | POA: Insufficient documentation

## 2008-01-11 LAB — CONVERTED CEMR LAB: INR: 2.6

## 2008-02-08 ENCOUNTER — Ambulatory Visit: Payer: Self-pay | Admitting: Internal Medicine

## 2008-03-05 ENCOUNTER — Encounter: Payer: Self-pay | Admitting: Internal Medicine

## 2008-03-11 ENCOUNTER — Encounter: Payer: Self-pay | Admitting: Internal Medicine

## 2008-03-12 ENCOUNTER — Ambulatory Visit: Payer: Self-pay | Admitting: Internal Medicine

## 2008-03-12 DIAGNOSIS — N259 Disorder resulting from impaired renal tubular function, unspecified: Secondary | ICD-10-CM | POA: Insufficient documentation

## 2008-03-12 LAB — CONVERTED CEMR LAB: Prothrombin Time: 22 s

## 2008-04-09 ENCOUNTER — Ambulatory Visit: Payer: Self-pay | Admitting: Internal Medicine

## 2008-04-09 LAB — CONVERTED CEMR LAB: INR: 3.1

## 2008-04-11 LAB — CONVERTED CEMR LAB
Basophils Absolute: 0 10*3/uL (ref 0.0–0.1)
Basophils Relative: 0.1 % (ref 0.0–3.0)
Calcium: 9.7 mg/dL (ref 8.4–10.5)
Cholesterol: 144 mg/dL (ref 0–200)
Creatinine, Ser: 1.4 mg/dL (ref 0.4–1.5)
Eosinophils Absolute: 0.1 10*3/uL (ref 0.0–0.7)
Ferritin: 38.9 ng/mL (ref 22.0–322.0)
GFR calc non Af Amer: 51 mL/min
Hemoglobin: 13.3 g/dL (ref 13.0–17.0)
Iron: 73 ug/dL (ref 42–165)
MCHC: 33.5 g/dL (ref 30.0–36.0)
MCV: 107.4 fL — ABNORMAL HIGH (ref 78.0–100.0)
Neutro Abs: 3.9 10*3/uL (ref 1.4–7.7)
Neutrophils Relative %: 75.6 % (ref 43.0–77.0)
RBC: 3.69 M/uL — ABNORMAL LOW (ref 4.22–5.81)
Total Bilirubin: 0.9 mg/dL (ref 0.3–1.2)
Transferrin: 216 mg/dL (ref >=212.0)
VLDL: 26 mg/dL (ref 0–40)
Vitamin B-12: 303 pg/mL (ref 211–911)

## 2008-05-12 ENCOUNTER — Ambulatory Visit: Payer: Self-pay | Admitting: Internal Medicine

## 2008-06-09 ENCOUNTER — Ambulatory Visit: Payer: Self-pay | Admitting: Internal Medicine

## 2008-06-09 LAB — CONVERTED CEMR LAB: Prothrombin Time: 16.4 s

## 2008-07-07 ENCOUNTER — Ambulatory Visit: Payer: Self-pay | Admitting: Internal Medicine

## 2008-07-07 LAB — CONVERTED CEMR LAB: INR: 3.1

## 2008-08-06 ENCOUNTER — Ambulatory Visit: Payer: Self-pay | Admitting: Internal Medicine

## 2008-08-06 LAB — CONVERTED CEMR LAB: Prothrombin Time: 23 s

## 2008-09-08 ENCOUNTER — Ambulatory Visit: Payer: Self-pay | Admitting: Internal Medicine

## 2008-09-08 LAB — CONVERTED CEMR LAB
INR: 2
Prothrombin Time: 17.3 s

## 2008-09-14 ENCOUNTER — Ambulatory Visit: Payer: Self-pay | Admitting: Cardiology

## 2008-09-14 ENCOUNTER — Inpatient Hospital Stay (HOSPITAL_COMMUNITY): Admission: EM | Admit: 2008-09-14 | Discharge: 2008-09-23 | Payer: Self-pay | Admitting: Emergency Medicine

## 2008-09-17 ENCOUNTER — Encounter: Payer: Self-pay | Admitting: Internal Medicine

## 2008-09-17 ENCOUNTER — Ambulatory Visit: Payer: Self-pay | Admitting: Vascular Surgery

## 2008-09-24 ENCOUNTER — Ambulatory Visit: Payer: Self-pay | Admitting: Cardiology

## 2008-09-25 ENCOUNTER — Encounter: Payer: Self-pay | Admitting: Internal Medicine

## 2008-09-25 ENCOUNTER — Telehealth: Payer: Self-pay | Admitting: Internal Medicine

## 2008-10-02 ENCOUNTER — Encounter: Payer: Self-pay | Admitting: Cardiology

## 2008-10-03 ENCOUNTER — Encounter: Payer: Self-pay | Admitting: Cardiology

## 2008-10-07 ENCOUNTER — Telehealth: Payer: Self-pay | Admitting: Internal Medicine

## 2008-10-07 ENCOUNTER — Ambulatory Visit: Payer: Self-pay | Admitting: Internal Medicine

## 2008-10-07 ENCOUNTER — Ambulatory Visit: Payer: Self-pay | Admitting: Cardiology

## 2008-10-14 ENCOUNTER — Telehealth: Payer: Self-pay | Admitting: Internal Medicine

## 2008-10-14 ENCOUNTER — Encounter: Payer: Self-pay | Admitting: Internal Medicine

## 2008-10-16 ENCOUNTER — Ambulatory Visit: Payer: Self-pay | Admitting: Family Medicine

## 2008-10-30 ENCOUNTER — Encounter (HOSPITAL_COMMUNITY): Admission: RE | Admit: 2008-10-30 | Discharge: 2009-01-01 | Payer: Self-pay | Admitting: Cardiology

## 2008-10-30 ENCOUNTER — Encounter: Payer: Self-pay | Admitting: Cardiology

## 2008-11-04 ENCOUNTER — Ambulatory Visit: Payer: Self-pay | Admitting: Internal Medicine

## 2008-11-04 LAB — CONVERTED CEMR LAB: INR: 1.6

## 2008-11-18 ENCOUNTER — Ambulatory Visit: Payer: Self-pay | Admitting: Internal Medicine

## 2008-11-18 LAB — CONVERTED CEMR LAB: INR: 2.2

## 2008-11-24 ENCOUNTER — Telehealth: Payer: Self-pay | Admitting: Internal Medicine

## 2008-12-02 ENCOUNTER — Encounter: Payer: Self-pay | Admitting: Internal Medicine

## 2008-12-02 ENCOUNTER — Encounter: Payer: Self-pay | Admitting: Cardiology

## 2008-12-10 ENCOUNTER — Encounter: Admission: RE | Admit: 2008-12-10 | Discharge: 2009-01-21 | Payer: Self-pay | Admitting: Internal Medicine

## 2008-12-16 ENCOUNTER — Ambulatory Visit: Payer: Self-pay | Admitting: Internal Medicine

## 2008-12-16 LAB — CONVERTED CEMR LAB
INR: 3.1
Prothrombin Time: 21.2 s

## 2008-12-27 ENCOUNTER — Encounter: Payer: Self-pay | Admitting: Internal Medicine

## 2009-01-05 ENCOUNTER — Encounter: Payer: Self-pay | Admitting: Internal Medicine

## 2009-01-09 ENCOUNTER — Telehealth: Payer: Self-pay | Admitting: Internal Medicine

## 2009-01-16 ENCOUNTER — Telehealth: Payer: Self-pay | Admitting: Internal Medicine

## 2009-01-16 ENCOUNTER — Ambulatory Visit: Payer: Self-pay | Admitting: Internal Medicine

## 2009-02-02 ENCOUNTER — Ambulatory Visit: Payer: Self-pay | Admitting: Cardiology

## 2009-02-12 ENCOUNTER — Ambulatory Visit: Payer: Self-pay | Admitting: Internal Medicine

## 2009-02-16 ENCOUNTER — Ambulatory Visit: Payer: Self-pay | Admitting: Family Medicine

## 2009-02-18 ENCOUNTER — Encounter: Payer: Self-pay | Admitting: Internal Medicine

## 2009-02-18 ENCOUNTER — Ambulatory Visit: Payer: Self-pay | Admitting: Family Medicine

## 2009-02-20 ENCOUNTER — Ambulatory Visit: Payer: Self-pay | Admitting: Internal Medicine

## 2009-02-20 LAB — CONVERTED CEMR LAB: Prothrombin Time: 18.2 s

## 2009-02-25 ENCOUNTER — Encounter (INDEPENDENT_AMBULATORY_CARE_PROVIDER_SITE_OTHER): Payer: Self-pay | Admitting: *Deleted

## 2009-03-24 ENCOUNTER — Ambulatory Visit: Payer: Self-pay | Admitting: Internal Medicine

## 2009-04-21 ENCOUNTER — Ambulatory Visit: Payer: Self-pay | Admitting: Internal Medicine

## 2009-04-21 LAB — CONVERTED CEMR LAB: Prothrombin Time: 16.8 s

## 2009-05-19 ENCOUNTER — Ambulatory Visit: Payer: Self-pay | Admitting: Internal Medicine

## 2009-05-19 LAB — CONVERTED CEMR LAB
INR: 2.1
Prothrombin Time: 17.9 s

## 2009-06-02 ENCOUNTER — Ambulatory Visit: Payer: Self-pay | Admitting: Cardiology

## 2009-06-23 ENCOUNTER — Ambulatory Visit: Payer: Self-pay | Admitting: Internal Medicine

## 2009-06-23 LAB — CONVERTED CEMR LAB: Prothrombin Time: 17.8 s

## 2009-06-24 LAB — CONVERTED CEMR LAB
AST: 26 units/L (ref 0–37)
BUN: 28 mg/dL — ABNORMAL HIGH (ref 6–23)
Basophils Absolute: 0 10*3/uL (ref 0.0–0.1)
Bilirubin, Direct: 0.2 mg/dL (ref 0.0–0.3)
CO2: 26 meq/L (ref 19–32)
Calcium: 9.4 mg/dL (ref 8.4–10.5)
Creatinine, Ser: 1.8 mg/dL — ABNORMAL HIGH (ref 0.4–1.5)
Eosinophils Relative: 1.5 % (ref 0.0–5.0)
Glucose, Bld: 80 mg/dL (ref 70–99)
HCT: 36.2 % — ABNORMAL LOW (ref 39.0–52.0)
HDL: 47.9 mg/dL (ref >39.00)
Hemoglobin: 11.9 g/dL — ABNORMAL LOW (ref 13.0–17.0)
Lymphs Abs: 1.1 10*3/uL (ref 0.7–4.0)
MCV: 102.5 fL — ABNORMAL HIGH (ref 78.0–100.0)
Monocytes Absolute: 0.6 10*3/uL (ref 0.1–1.0)
Monocytes Relative: 9.3 % (ref 3.0–12.0)
Neutro Abs: 4.5 10*3/uL (ref 1.4–7.7)
RDW: 13.9 % (ref 11.5–14.6)
Sodium: 139 meq/L (ref 135–145)
TSH: 1.35 microintl units/mL (ref 0.35–5.50)
Total Bilirubin: 0.8 mg/dL (ref 0.3–1.2)
Total CHOL/HDL Ratio: 3

## 2009-06-29 LAB — CONVERTED CEMR LAB
Saturation Ratios: 14.4 % — ABNORMAL LOW (ref 20.0–50.0)
Vitamin B-12: 551 pg/mL (ref 211–911)

## 2009-07-21 ENCOUNTER — Ambulatory Visit: Payer: Self-pay | Admitting: Internal Medicine

## 2009-07-21 LAB — CONVERTED CEMR LAB: INR: 1.9

## 2009-07-30 ENCOUNTER — Ambulatory Visit: Payer: Self-pay | Admitting: Internal Medicine

## 2009-07-30 LAB — CONVERTED CEMR LAB
Albumin ELP: 57.3 % (ref 55.8–66.1)
Alpha-1-Globulin: 6.2 % — ABNORMAL HIGH (ref 2.9–4.9)
Alpha-2-Globulin: 9.5 % (ref 7.1–11.8)
Beta Globulin: 5.7 % (ref 4.7–7.2)
Eosinophils Relative: 1.1 % (ref 0.0–5.0)
Lymphocytes Relative: 13.2 % (ref 12.0–46.0)
Monocytes Relative: 7.9 % (ref 3.0–12.0)
Neutrophils Relative %: 77.5 % — ABNORMAL HIGH (ref 43.0–77.0)
Platelets: 125 10*3/uL — ABNORMAL LOW (ref 150.0–400.0)
Total Protein, Serum Electrophoresis: 6.9 g/dL (ref 6.0–8.3)
WBC: 7.4 10*3/uL (ref 4.5–10.5)

## 2009-08-04 ENCOUNTER — Ambulatory Visit: Payer: Self-pay | Admitting: Internal Medicine

## 2009-08-04 LAB — CONVERTED CEMR LAB: INR: 1.4

## 2009-08-18 ENCOUNTER — Ambulatory Visit: Payer: Self-pay | Admitting: Internal Medicine

## 2009-08-27 ENCOUNTER — Emergency Department (HOSPITAL_COMMUNITY): Admission: EM | Admit: 2009-08-27 | Discharge: 2009-08-27 | Payer: Self-pay | Admitting: Emergency Medicine

## 2009-09-03 ENCOUNTER — Ambulatory Visit: Payer: Self-pay | Admitting: Internal Medicine

## 2009-09-03 LAB — CONVERTED CEMR LAB
Bilirubin Urine: NEGATIVE
Blood in Urine, dipstick: NEGATIVE
Glucose, Urine, Semiquant: NEGATIVE
pH: 5

## 2009-09-07 LAB — CONVERTED CEMR LAB
ALT: 21 units/L (ref 0–53)
AST: 23 units/L (ref 0–37)
Albumin: 3.8 g/dL (ref 3.5–5.2)
Alkaline Phosphatase: 95 units/L (ref 39–117)
Basophils Relative: 0.2 % (ref 0.0–3.0)
CO2: 26 meq/L (ref 19–32)
Calcium: 9.6 mg/dL (ref 8.4–10.5)
Eosinophils Absolute: 0.1 10*3/uL (ref 0.0–0.7)
Eosinophils Relative: 1.1 % (ref 0.0–5.0)
Hemoglobin: 14.8 g/dL (ref 13.0–17.0)
Lymphocytes Relative: 12.5 % (ref 12.0–46.0)
MCHC: 34.1 g/dL (ref 30.0–36.0)
Monocytes Relative: 6.6 % (ref 3.0–12.0)
Neutro Abs: 5.5 10*3/uL (ref 1.4–7.7)
RBC: 4.19 M/uL — ABNORMAL LOW (ref 4.22–5.81)
Sodium: 143 meq/L (ref 135–145)
Total Protein: 6.4 g/dL (ref 6.0–8.3)

## 2009-09-18 ENCOUNTER — Ambulatory Visit: Payer: Self-pay | Admitting: Internal Medicine

## 2009-09-18 LAB — CONVERTED CEMR LAB: INR: 2.2

## 2009-10-07 ENCOUNTER — Ambulatory Visit: Payer: Self-pay | Admitting: Vascular Surgery

## 2009-10-07 ENCOUNTER — Observation Stay (HOSPITAL_COMMUNITY): Admission: EM | Admit: 2009-10-07 | Discharge: 2009-10-09 | Payer: Self-pay | Admitting: Emergency Medicine

## 2009-10-07 ENCOUNTER — Encounter (INDEPENDENT_AMBULATORY_CARE_PROVIDER_SITE_OTHER): Payer: Self-pay | Admitting: Emergency Medicine

## 2009-10-08 ENCOUNTER — Ambulatory Visit: Payer: Self-pay | Admitting: Oncology

## 2009-10-08 ENCOUNTER — Encounter: Payer: Self-pay | Admitting: Cardiology

## 2009-10-12 ENCOUNTER — Ambulatory Visit: Payer: Self-pay | Admitting: Internal Medicine

## 2009-10-12 LAB — CONVERTED CEMR LAB: INR: 1.6

## 2009-10-19 ENCOUNTER — Ambulatory Visit: Payer: Self-pay | Admitting: Internal Medicine

## 2009-10-19 DIAGNOSIS — I82409 Acute embolism and thrombosis of unspecified deep veins of unspecified lower extremity: Secondary | ICD-10-CM | POA: Insufficient documentation

## 2009-10-28 ENCOUNTER — Ambulatory Visit: Payer: Self-pay | Admitting: Internal Medicine

## 2009-11-12 ENCOUNTER — Ambulatory Visit: Payer: Self-pay | Admitting: Internal Medicine

## 2009-12-10 ENCOUNTER — Ambulatory Visit: Payer: Self-pay | Admitting: Internal Medicine

## 2009-12-23 ENCOUNTER — Ambulatory Visit: Payer: Self-pay | Admitting: Cardiology

## 2010-01-07 ENCOUNTER — Ambulatory Visit: Payer: Self-pay | Admitting: Internal Medicine

## 2010-01-07 LAB — CONVERTED CEMR LAB: INR: 1.6

## 2010-01-22 ENCOUNTER — Ambulatory Visit: Payer: Self-pay | Admitting: Internal Medicine

## 2010-02-05 ENCOUNTER — Ambulatory Visit: Payer: Self-pay | Admitting: Internal Medicine

## 2010-02-19 ENCOUNTER — Ambulatory Visit: Payer: Self-pay | Admitting: Internal Medicine

## 2010-02-19 LAB — CONVERTED CEMR LAB: INR: 2.3

## 2010-03-01 ENCOUNTER — Ambulatory Visit: Payer: Self-pay | Admitting: Internal Medicine

## 2010-03-02 LAB — CONVERTED CEMR LAB
Albumin: 4 g/dL (ref 3.5–5.2)
Alkaline Phosphatase: 96 units/L (ref 39–117)
Basophils Absolute: 0 10*3/uL (ref 0.0–0.1)
Basophils Relative: 0.3 % (ref 0.0–3.0)
CO2: 26 meq/L (ref 19–32)
Calcium: 9.8 mg/dL (ref 8.4–10.5)
Chloride: 108 meq/L (ref 96–112)
Cholesterol: 159 mg/dL (ref 0–200)
Eosinophils Absolute: 0.1 10*3/uL (ref 0.0–0.7)
Glucose, Bld: 90 mg/dL (ref 70–99)
HCT: 44.9 % (ref 39.0–52.0)
HDL: 44.5 mg/dL (ref >39.00)
Hemoglobin: 15.1 g/dL (ref 13.0–17.0)
Lymphocytes Relative: 16.5 % (ref 12.0–46.0)
Lymphs Abs: 1 10*3/uL (ref 0.7–4.0)
MCHC: 33.7 g/dL (ref 30.0–36.0)
MCV: 105 fL — ABNORMAL HIGH (ref 78.0–100.0)
Neutro Abs: 4.6 10*3/uL (ref 1.4–7.7)
Potassium: 4.7 meq/L (ref 3.5–5.1)
RBC: 4.27 M/uL (ref 4.22–5.81)
RDW: 15.1 % — ABNORMAL HIGH (ref 11.5–14.6)
Sodium: 142 meq/L (ref 135–145)
TSH: 1.39 microintl units/mL (ref 0.35–5.50)
Total Protein: 6.7 g/dL (ref 6.0–8.3)
VLDL: 30.8 mg/dL (ref 0.0–40.0)

## 2010-03-19 ENCOUNTER — Ambulatory Visit: Payer: Self-pay | Admitting: Internal Medicine

## 2010-04-01 ENCOUNTER — Ambulatory Visit: Payer: Self-pay | Admitting: Internal Medicine

## 2010-04-01 LAB — CONVERTED CEMR LAB
Basophils Absolute: 0 10*3/uL (ref 0.0–0.1)
Eosinophils Absolute: 0.1 10*3/uL (ref 0.0–0.7)
HCT: 43.4 % (ref 39.0–52.0)
INR: 2.3
Lymphs Abs: 1 10*3/uL (ref 0.7–4.0)
MCV: 105.5 fL — ABNORMAL HIGH (ref 78.0–100.0)
Monocytes Absolute: 0.6 10*3/uL (ref 0.1–1.0)
Monocytes Relative: 8.4 % (ref 3.0–12.0)
Platelets: 108 10*3/uL — ABNORMAL LOW (ref 150.0–400.0)
RDW: 15.1 % — ABNORMAL HIGH (ref 11.5–14.6)

## 2010-04-08 ENCOUNTER — Inpatient Hospital Stay (HOSPITAL_COMMUNITY)
Admission: EM | Admit: 2010-04-08 | Discharge: 2010-04-10 | Payer: Self-pay | Source: Home / Self Care | Attending: Cardiovascular Disease | Admitting: Cardiovascular Disease

## 2010-04-08 LAB — TROPONIN I
Troponin I: 0.02 ng/mL (ref 0.00–0.06)
Troponin I: 0.03 ng/mL (ref 0.00–0.06)

## 2010-04-08 LAB — BASIC METABOLIC PANEL
BUN: 24 mg/dL — ABNORMAL HIGH (ref 6–23)
CO2: 26 mEq/L (ref 19–32)
Calcium: 9.5 mg/dL (ref 8.4–10.5)
Chloride: 109 mEq/L (ref 96–112)
Creatinine, Ser: 1.35 mg/dL (ref 0.4–1.5)
GFR calc Af Amer: >60 mL/min (ref >60)
GFR calc non Af Amer: 50 mL/min — ABNORMAL LOW (ref >60)
Glucose, Bld: 113 mg/dL — ABNORMAL HIGH (ref 70–99)
Potassium: 4.2 mEq/L (ref 3.5–5.1)
Sodium: 140 mEq/L (ref 135–145)

## 2010-04-08 LAB — PROTIME-INR
INR: 2.13 — ABNORMAL HIGH (ref 0.00–1.49)
Prothrombin Time: 24 seconds — ABNORMAL HIGH (ref 11.6–15.2)

## 2010-04-08 LAB — CARDIAC PANEL(CRET KIN+CKTOT+MB+TROPI)
CK, MB: 2 ng/mL (ref 0.3–4.0)
Relative Index: INVALID (ref 0.0–2.5)
Total CK: 39 U/L (ref 7–232)
Troponin I: 0.02 ng/mL (ref 0.00–0.06)

## 2010-04-08 LAB — CBC
HCT: 41.4 % (ref 39.0–52.0)
Hemoglobin: 14 g/dL (ref 13.0–17.0)
MCH: 33.7 pg (ref 26.0–34.0)
MCHC: 33.8 g/dL (ref 30.0–36.0)
MCV: 99.5 fL (ref 78.0–100.0)
Platelets: 105 10*3/uL — ABNORMAL LOW (ref 150–400)
RBC: 4.16 MIL/uL — ABNORMAL LOW (ref 4.22–5.81)
RDW: 13.5 % (ref 11.5–15.5)
WBC: 8.2 10*3/uL (ref 4.0–10.5)

## 2010-04-08 LAB — URINALYSIS, ROUTINE W REFLEX MICROSCOPIC
Bilirubin Urine: NEGATIVE
Hemoglobin, Urine: NEGATIVE
Ketones, ur: NEGATIVE mg/dL
Nitrite: NEGATIVE
Protein, ur: 30 mg/dL — AB
Specific Gravity, Urine: 1.017 (ref 1.005–1.030)
Urine Glucose, Fasting: NEGATIVE mg/dL
Urobilinogen, UA: 0.2 mg/dL (ref 0.0–1.0)
pH: 5 (ref 5.0–8.0)

## 2010-04-08 LAB — DIFFERENTIAL
Basophils Absolute: 0 10*3/uL (ref 0.0–0.1)
Basophils Relative: 0 % (ref 0–1)
Eosinophils Absolute: 0.2 10*3/uL (ref 0.0–0.7)
Eosinophils Relative: 2 % (ref 0–5)
Lymphocytes Relative: 12 % (ref 12–46)
Lymphs Abs: 1 10*3/uL (ref 0.7–4.0)
Monocytes Absolute: 0.7 10*3/uL (ref 0.1–1.0)
Monocytes Relative: 9 % (ref 3–12)
Neutro Abs: 6.3 10*3/uL (ref 1.7–7.7)
Neutrophils Relative %: 77 % (ref 43–77)

## 2010-04-08 LAB — CK TOTAL AND CKMB (NOT AT ARMC)
CK, MB: 2.8 ng/mL (ref 0.3–4.0)
CK, MB: 2.3 ng/mL (ref 0.3–4.0)
Relative Index: INVALID (ref 0.0–2.5)
Relative Index: INVALID (ref 0.0–2.5)
Total CK: 46 U/L (ref 7–232)
Total CK: 38 U/L (ref 7–232)

## 2010-04-08 LAB — URINE MICROSCOPIC-ADD ON

## 2010-04-08 LAB — TSH: TSH: 1.234 u[IU]/mL (ref 0.350–4.500)

## 2010-04-09 ENCOUNTER — Encounter: Payer: Self-pay | Admitting: Cardiology

## 2010-04-09 LAB — LIPID PANEL
Cholesterol: 130 mg/dL (ref 0–200)
HDL: 43 mg/dL (ref >39)
LDL Cholesterol: 64 mg/dL (ref 0–99)
Total CHOL/HDL Ratio: 3 RATIO
Triglycerides: 115 mg/dL (ref <150)
VLDL: 23 mg/dL (ref 0–40)

## 2010-04-09 LAB — PROTIME-INR
INR: 2.13 — ABNORMAL HIGH (ref 0.00–1.49)
Prothrombin Time: 24 seconds — ABNORMAL HIGH (ref 11.6–15.2)

## 2010-04-09 LAB — CARDIAC PANEL(CRET KIN+CKTOT+MB+TROPI)
CK, MB: 2.1 ng/mL (ref 0.3–4.0)
Relative Index: INVALID (ref 0.0–2.5)
Total CK: 39 U/L (ref 7–232)
Troponin I: 0.02 ng/mL (ref 0.00–0.06)

## 2010-04-12 ENCOUNTER — Ambulatory Visit: Admission: RE | Admit: 2010-04-12 | Payer: Self-pay | Source: Home / Self Care | Admitting: Cardiology

## 2010-04-13 ENCOUNTER — Ambulatory Visit
Admission: RE | Admit: 2010-04-13 | Discharge: 2010-04-13 | Payer: Self-pay | Source: Home / Self Care | Attending: Internal Medicine | Admitting: Internal Medicine

## 2010-04-13 LAB — CONVERTED CEMR LAB: INR: 1.8

## 2010-04-19 LAB — BASIC METABOLIC PANEL
BUN: 29 mg/dL — ABNORMAL HIGH (ref 6–23)
CO2: 27 mEq/L (ref 19–32)
Calcium: 9.6 mg/dL (ref 8.4–10.5)
Chloride: 106 mEq/L (ref 96–112)
Creatinine, Ser: 1.39 mg/dL (ref 0.4–1.5)
GFR calc Af Amer: 58 mL/min — ABNORMAL LOW (ref >60)
GFR calc non Af Amer: 48 mL/min — ABNORMAL LOW (ref >60)
Glucose, Bld: 92 mg/dL (ref 70–99)
Potassium: 4.8 mEq/L (ref 3.5–5.1)
Sodium: 140 mEq/L (ref 135–145)

## 2010-04-19 LAB — PROTIME-INR
INR: 1.74 — ABNORMAL HIGH (ref 0.00–1.49)
Prothrombin Time: 20.5 seconds — ABNORMAL HIGH (ref 11.6–15.2)

## 2010-04-19 LAB — CBC
HCT: 42.9 % (ref 39.0–52.0)
Hemoglobin: 14.4 g/dL (ref 13.0–17.0)
MCH: 33.7 pg (ref 26.0–34.0)
MCHC: 33.6 g/dL (ref 30.0–36.0)
MCV: 100.5 fL — ABNORMAL HIGH (ref 78.0–100.0)
Platelets: 97 10*3/uL — ABNORMAL LOW (ref 150–400)
RBC: 4.27 MIL/uL (ref 4.22–5.81)
RDW: 13.6 % (ref 11.5–15.5)
WBC: 5.6 10*3/uL (ref 4.0–10.5)

## 2010-04-19 LAB — APTT: aPTT: 30 seconds (ref 24–37)

## 2010-04-20 ENCOUNTER — Ambulatory Visit: Admit: 2010-04-20 | Payer: Self-pay | Admitting: Internal Medicine

## 2010-04-21 ENCOUNTER — Other Ambulatory Visit: Payer: Self-pay | Admitting: Cardiovascular Disease

## 2010-04-21 ENCOUNTER — Ambulatory Visit
Admission: RE | Admit: 2010-04-21 | Discharge: 2010-04-21 | Payer: Self-pay | Source: Home / Self Care | Attending: Cardiovascular Disease | Admitting: Cardiovascular Disease

## 2010-04-21 LAB — PROTIME-INR
INR: 2.5 ratio — ABNORMAL HIGH (ref 0.8–1.0)
Prothrombin Time: 25.9 s — ABNORMAL HIGH (ref 10.2–12.4)

## 2010-04-23 NOTE — Consult Note (Signed)
NAMEDERIC, BOCOCK                ACCOUNT NO.:  [id number]  MEDICAL RECORD NO.:  [id number]          PATIENT TYPE:  INP  LOCATION:  3730                         FACILITY:  [facility]  PHYSICIAN:  Jesse Sans. Wall, MD, FACCDATE OF BIRTH:  03/12/1920  DATE OF CONSULTATION:  04/08/2010 DATE OF DISCHARGE:                                CONSULTATION   PRIMARY CARDIOLOGIST:  Formerly Dr. Charlies Constable and in the future will be followed by Dr. Verne Carrow.  PRIMARY CARE PHYSICIAN:  Valetta Mole. Swords, MD  REASON FOR CONSULTATION:  Chest discomfort.  HISTORY OF PRESENT ILLNESS:  Mr. Salinger is a very pleasant 77 year old Caucasian male with a known history of coronary artery disease dating back to 71.  At that time, he had an anterior wall MI with t-PA and subsequent PTCA of the LAD, then had a stent placed in the right coronary artery in 1995.  Most recently in 2010, the patient had a 2.5 x 28-mm Xience drug-eluting stent placed to the second obtuse marginal. 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  The patient was in his usual state of health until approximately 3 a.m. the morning when he was just dozing, but became aware of substernal chest pressure that was fairly severe, 8/10 in severity, there was not resolving, and he took sublingual nitroglycerin.  After his first sublingual nitroglycerin, there was no relief and so after 5 minutes he took another one and then a third sublingual nitroglycerin after another 5 minutes.  5 minutes after his third sublingual nitroglycerin, his chest discomfort resolved and has not returned.  However, EMS was called and brought him to [facility] ED for further evaluation.  Here in the emergency  department, EKG did not show any concerning changes prior to an old tracing from June 2010 and his first only set of cardiac markers are within normal limits.  No other significant findings in labs other than INR therapeutic at 2.14.  Chest x-ray shows no acute pulmonary process.  Vital signs within normal limits and stable.  Currently, he has no complaints.  Note, the patient and daughter report 2 recent mechanical falls without any trauma.  PAST MEDICAL HISTORY: 1. CAD.     a.     Anterior MI with t-PA and PTCA to the LAD, 1987.     b.     PCI and stent to RCA, 1995.     c.     2.5 x 28 mm Xience DES to OM-2, 2010 (single vessel disease      only noted on cath at that time). 2. History of DVT/PE (protein S deficiency).     a.     Chronically anticoagulated on Coumadin. 3. Hypertension. 4. Hyperlipidemia. 5. Anemia (negative GI workup). 6. Ischemic cardiomyopathy.     a.     2-D echocardiogram March 2009:  LVEF 45% with akinesis of      the  periapical wall and abnormal LV relaxation as well as mild LV      dilatation and left atrium mildly dilated. 7. History of TIAs. 8. Gout. 9. History of thrombophlebitis July 2011.  SOCIAL HISTORY:  The patient lives in [place] with his wife.  He is retired Management consultant, also worked in Technical sales engineer previously.  He has no smoking or significant EtOH history.  No illicit drugs.  No herbal meds. Regular diet.  No regular exercise, but is active and independent in terms of daily activities.  FAMILY HISTORY:  Noncontributory secondary to the patient's advanced age.  REVIEW OF SYSTEMS:  Please see HPI.  All other systems were reviewed and were negative.  Note, the patient denies any orthopnea, PND, lower extremity edema, dyspnea on exertion, palpitations, presyncope, did report some nausea with symptoms in HPI, but no vomiting.  No recent fevers, chills, or significant cough (minimal cough recently nonproductive).  CODE STATUS:   Full.  ALLERGIES AND INTOLERANCES: 1. MORPHINE SULFATE. 2. CARVEDILOL (hypertension).  MEDICATIONS: 1. Simvastatin 40 mg p.o. at bedtime. 2. Sublingual nitroglycerin p.r.n. for chest discomfort. 3. Toprol 12.5 mg p.o. b.i.d. 4. Allopurinol 300 mg p.o. daily. 5. Enteric-coated aspirin 81 mg p.o. daily. 6. Coumadin.  PHYSICAL EXAMINATION:  VITAL SIGNS:  Temperature 97.9 degrees Fahrenheit, BP 115 to 122 over 67 to 69, pulse 60 to 63, respirations 12 to 20, O2 saturation 99% on room air by nasal cannula. GENERAL:  The patient is osteoarthritis x3 in no apparent distress. Able to speak easily in full sentence without respiratory distress. HEENT:  Head is normocephalic, atraumatic.  Pupils equal, round, and reactive to light.  Extraocular muscles are intact.  Nares are patent without discharge.  Dentition is good.  Oropharynx without erythema or exudates. NECK:  Supple without lymphadenopathy.  No thyromegaly.  No JVD. HEART:  Rate is regular with audible S1 and S2.  No clicks, rubs, murmurs, or gallops, but heart sounds are distant.  Pulses are 2+ equal in both upper and lower extremities bilaterally. SKIN:  No rashes, lesions, or petechiae. ABDOMEN:  Soft, nontender, nondistended.  Normal abdominal bowel sounds. No rebound or guarding.  No hepatosplenomegaly. EXTREMITIES:  Without clubbing, cyanosis, or edema. MUSCULOSKELETAL:  Without joint deformity or effusions.  No spinal or CVA tenderness. NEURO:  Cranial nerves II through XII grossly intact.  Strength 5/5 in all extremities and axial groups.  Normal sensation throughout and normal cerebellar function (as assessed in the hospital bed only).  RADIOLOGY:  Chest x-ray showed no acute degree pulmonale process.  EKG:  Sinus bradycardia with first-degree AV block, heart rate 57 bpm, no significant changes from prior tracing completed in June 2010.  LABORATORY DATA:  WBC is 8.2, HGB 14.0, HCT 41.4, PLT count is 105. Sodium 140,  potassium 4.2, chloride 109, bicarb 26, BUN 24, creatinine 1.35, glucose 113.  Protime 24, INR 2.14.  First full set of enzymes, CK 46, MB 2.8, troponin 0.02.  Urinalysis significant for trace amount of leukocyte esterase, 30 protein, and rare bacteria.  ASSESSMENT/PLAN:  The patient initially seen by Lenard Galloway, PA-C and then seen and thoroughly assessed by attending cardiologist, Dr. Valera Castle.  Mr. Coven is an 77 year old Caucasian gentleman with the above-noted complex medical history including coronary artery disease dating back to 10 most recently/DES to obtuse marginal #2 in 2010 as well as history of PE/DVT secondary to protein S deficiency, chronic anticoagulation on Coumadin, ischemic cardiomyopathy with LVEF 45%, hypertension, hyperlipidemia, and history of chest TIAs who  presents now with symptoms concerning for unstable angina.  Unstable angina - the patient did respond after a third sublingual nitroglycerin, currently on low-dose aspirin and anticoagulated on Coumadin with therapeutic INR today at 2.1.  Initial set of enzymes negative, full set pending.  We would recommend IV nitroglycerin, hold Coumadin, plan for cardiac cath tomorrow when INR is less than or equal to 1.6.  This plan has been discussed including risks and benefits with both the patient and his daughter and they agree to proceed.  Thanks for the consult.     Jarrett Ables, PAC   ______________________________ Jesse Sans. Daleen Squibb, MD, [facility]    MS/MEDQ  D:  04/08/2010  T:  04/08/2010  Job:  [id number]  Electronically Signed by Jarrett Ables PAC on 04/14/2010 03:08:05 PM Electronically Signed by Valera Castle MD FACC on 04/23/2010 04:14:24 PM

## 2010-04-29 ENCOUNTER — Ambulatory Visit: Admit: 2010-04-29 | Payer: Self-pay | Admitting: Internal Medicine

## 2010-04-29 NOTE — Discharge Summary (Addendum)
NAMEKENYATTE, [name]                ACCOUNT NO.:  [id number]  MEDICAL RECORD NO.:  [id number]          PATIENT TYPE:  INP  LOCATION:  3730                         FACILITY:  [facility]  PHYSICIAN:  Rollene Rotunda, MD, FACCDATE OF BIRTH:  June 07, 1919  DATE OF ADMISSION:  04/08/2010 DATE OF DISCHARGE:  04/10/2010                              DISCHARGE SUMMARY   PRIMARY CARDIOLOGIST:  Verne Carrow, MD.  PRIMARY CARE PHYSICIAN:  Valetta Mole. Swords, MD.  DISCHARGE DIAGNOSES: 1. Coronary artery disease, currently chest pain free.     a.     Remote history of anterior wall myocardial infarction in      1987, status post PTCA to the LAD.     b.     Stenting of the RCA in 1994.     c.     Status post stenting of obtuse marginal in June 2010. 2. History of deep vein thrombosis/pulmonary embolism. 3. Hypertension. 4. Ischemic cardiomyopathy, ejection fraction 45%.  SECONDARY DIAGNOSES: 1. Hyperlipidemia. 2. Anemia with negative GI workup. 3. History of transient ischemic attacks.  PROCEDURE/DIAGNOSTICS PERFORMED DURING HOSPITALIZATION:  Chest x-ray showed no acute cardiopulmonary disease.  HISTORY OF PRESENT ILLNESS:  This is an 77 year old gentleman with the above-stated problem list who presented with symptoms concerning for unstable angina.  The patient had substernal chest pressure that was 8/10 in severity.  The patient called EMS and was brought to the [facility] Emergency Department.  EKG showed no acute changes and remained unchanged from tracing in June 2010.  The patient did respond to a third sublingual nitroglycerin and was currently chest pain free.  The patient was admitted for further evaluation.  HOSPITAL COURSE:  The patient ruled out for myocardial infarction.  EKG remained without ischemic changes.  The patient had no further complaints of chest pain or shortness of breath.  A 2-D echocardiogram was ordered, but results are unable to be viewed secondary to the  system being down.  These will be reviewed as an outpatient and discussed with the patient at that time.  Dr. Clifton Schuyler Olden discussed alternatives with the daughter and the patient.  They prefer conservative management, therefore the patient was ambulated.  He was able to do this without difficulty.  No further complaints of chest pain.  With the extensive ambulation and no chest pain, it was decided that patient was stable for discharge.  If chest pain reoccurs, then cardiac catheterization will be considered.  The patient's blood pressure was mildly elevated, therefore lisinopril 5 mg has been added to his regimen.  This will be continued as an outpatient.  The patient was continued on his chronic Coumadin therapy and will have close outpatient followup in 3 days.    The patient was evaluated by Dr. Antoine Poche on day of discharge, who noted him stable for home.  Discharge instructions have been discussed with the patient and son, and they voiced understanding.  DISCHARGE LABS:  WBC 5.6, hemoglobin 14.4, hematocrit 42.9, and platelets 97.  INR 1.74.  Sodium 148, potassium 4.8, chloride 106, bicarb 27, BUN 29, and creatinine 1.39.  Cardiac enzymes negative x3. Cholesterol 138, triglycerides 115,  HDL 43, and LDL 64.  DISCHARGE MEDICATIONS: 1. Lisinopril 5 mg daily. 2. Allopurinol 300 mg 1 tablet daily. 3. Aspirin 81 mg daily. 4. Coumadin 5 mg, use as directed. 5. Metoprolol XL succinate 25 mg, half tablet twice daily. 6. Nitroglycerin sublingual 0.4 mg 1 tablet under tongue every 5     minutes as needed for chest pain. 7. Simvastatin 40 mg daily. 8. Tylenol 650 mg every 6 hours as needed for pain. 9. Vitamin C 1000 mg 1 capsule daily.  DISCHARGE PLANS AND INSTRUCTIONS: 1. The patient will follow up in the [facility] for INR draw on     Tuesday, January 10. 2. The patient will follow up with Dr. Clifton Janin Kozlowski in 10-14 days, our     office will call to schedule this appointment. 3. The  patient is to increase activity slowly and stop any activity     that causes chest pain or shortness of breath. 4. The patient is to continue low-sodium heart-healthy diet. 5. The patient is to call our office for any difficulties or problems.  Duration of discharge, greater than 30 minutes with physician and physician extender time.     Leonette Monarch, PA-C   ______________________________ Rollene Rotunda, MD, [facility]    NB/MEDQ  D:  04/10/2010  T:  04/11/2010  Job:  [id number]  cc:   Valetta Mole. Swords, MD  Electronically Signed by Alen Blew P.A. on 04/15/2010 08:22:34 AM Electronically Signed by Rollene Rotunda MD [facility] on 04/29/2010 12:18:44 PM

## 2010-05-04 ENCOUNTER — Ambulatory Visit
Admission: RE | Admit: 2010-05-04 | Discharge: 2010-05-04 | Payer: Self-pay | Source: Home / Self Care | Attending: Internal Medicine | Admitting: Internal Medicine

## 2010-05-04 DIAGNOSIS — R269 Unspecified abnormalities of gait and mobility: Secondary | ICD-10-CM | POA: Insufficient documentation

## 2010-05-06 NOTE — Assessment & Plan Note (Signed)
Summary: pt/njr   Nurse Visit   Allergies: 1)  Morphine Sulfate (Morphine Sulfate) Laboratory Results   Blood Tests      INR: 2.7   (Normal Range: 0.88-1.12   Therap INR: 2.0-3.5) Comments: Rita Ohara  December 10, 2009 10:39 AM     Orders Added: 1)  Est. Patient Level I [99211] 2)  Protime [[id number]]   ANTICOAGULATION RECORD PREVIOUS REGIMEN & LAB RESULTS Anticoagulation Diagnosis:  Deep venous thrombosis pulmonary emblilsm on  10/23/2006 Previous INR Goal Range:  2.0-3.0 on  06/23/2009 Previous INR:  2.2 on  10/28/2009 Previous Coumadin Dose(mg):  2.5mg  on tue,thu 5mg  other days on  07/21/2009 Previous Regimen:  resume normal dose on  11/12/2009 Previous Coagulation Comments:  Dr. Lovell Sheehan approved on  10/12/2009  NEW REGIMEN & LAB RESULTS Current INR: 2.7 Regimen: same  Repeat testing in: 4 weeks  Anticoagulation Visit Questionnaire Coumadin dose missed/changed:  No Abnormal Bleeding Symptoms:  No  Any diet changes including alcohol intake, vegetables or greens since the last visit:  No Any illnesses or hospitalizations since the last visit:  No Any signs of clotting since the last visit (including chest discomfort, dizziness, shortness of breath, arm tingling, slurred speech, swelling or redness in leg):  No  MEDICATIONS ALLOPURINOL 300 MG TABS (ALLOPURINOL) Take 1 tablet by mouth once a day WARFARIN SODIUM 5 MG TABS (WARFARIN SODIUM) Take 1 tablet by mouth once a day exceptTues. and Thurs. 2.5 mg ZOCOR 40 MG  TABS (SIMVASTATIN) 1 once daily BABY ASPIRIN 81 MG  CHEW (ASPIRIN) 1QD TYLENOL ARTHRITIS PAIN 650 MG  CR-TABS (ACETAMINOPHEN) as needed VITAMIN C 1000 MG TABS (ASCORBIC ACID) once daily METOPROLOL TARTRATE 25 MG TABS (METOPROLOL TARTRATE) 1/2 by mouth two times a day NITROGLYCERIN 0.4 MG SUBL (NITROGLYCERIN) One tablet under tongue every 5 minutes as needed for chest pain---may repeat times three TRAMADOL HCL 50 MG TABS (TRAMADOL HCL) one every 6  hours as needed for pain

## 2010-05-06 NOTE — Assessment & Plan Note (Signed)
Summary: PT/RCD   Nurse Visit   Allergies: 1)  Morphine Sulfate (Morphine Sulfate) Laboratory Results   Blood Tests   Date/Time Received: September 18, 2009 2:45 PM  Date/Time Reported: September 18, 2009 2:45 PM    INR: 2.2   (Normal Range: 0.88-1.12   Therap INR: 2.0-3.5) Comments: Wynona Canes, CMA  September 18, 2009 2:45 PM     Orders Added: 1)  Est. Patient Level I [99211] 2)  Protime [[id number]]  Laboratory Results   Blood Tests      INR: 2.2   (Normal Range: 0.88-1.12   Therap INR: 2.0-3.5) Comments: Wynona Canes, CMA  September 18, 2009 2:45 PM       ANTICOAGULATION RECORD PREVIOUS REGIMEN & LAB RESULTS Anticoagulation Diagnosis:  Deep venous thrombosis pulmonary emblilsm on  10/23/2006 Previous INR Goal Range:  2.0-3.0 on  06/23/2009 Previous INR:  2.3 on  08/18/2009 Previous Coumadin Dose(mg):  2.5mg  on tue,thu 5mg  other days on  07/21/2009 Previous Regimen:  Same dose on  08/18/2009 Previous Coagulation Comments:  Approved by Dr. Kirtland Bouchard on  11/04/2008  NEW REGIMEN & LAB RESULTS Current INR: 2.2 Regimen: Same dose  (no change)       Repeat testing in: 4 weeks MEDICATIONS ALLOPURINOL 300 MG TABS (ALLOPURINOL) Take 1 tablet by mouth once a day WARFARIN SODIUM 5 MG TABS (WARFARIN SODIUM) Take 1 tablet by mouth once a day exceptTues. and Thurs. 2.5 mg ZOCOR 40 MG  TABS (SIMVASTATIN) 1 once daily BABY ASPIRIN 81 MG  CHEW (ASPIRIN) 1QD VITAMIN C 1000 MG  TABS (ASCORBIC ACID) once daily HYDROCODONE-ACETAMINOPHEN 5-325 MG TABS (HYDROCODONE-ACETAMINOPHEN) 1 by mouth up to 4 times per day as needed for pain TYLENOL ARTHRITIS PAIN 650 MG  CR-TABS (ACETAMINOPHEN) as needed VITAMIN C 1000 MG TABS (ASCORBIC ACID) once daily METOPROLOL TARTRATE 25 MG TABS (METOPROLOL TARTRATE) 1/2 by mouth two times a day NITROGLYCERIN 0.4 MG SUBL (NITROGLYCERIN) One tablet under tongue every 5 minutes as needed for chest pain---may repeat times three FERROUS SULFATE 325 (65 FE) MG TABS  (FERROUS SULFATE) Take 1 tablet by mouth two times a day   Anticoagulation Visit Questionnaire      Coumadin dose missed/changed:  No      Abnormal Bleeding Symptoms:  No   Any diet changes including alcohol intake, vegetables or greens since the last visit:  No Any illnesses or hospitalizations since the last visit:  No Any signs of clotting since the last visit (including chest discomfort, dizziness, shortness of breath, arm tingling, slurred speech, swelling or redness in leg):  No

## 2010-05-06 NOTE — Assessment & Plan Note (Signed)
Summary: pt/njr   Nurse Visit   Allergies: 1)  Morphine Sulfate (Morphine Sulfate) Laboratory Results   Blood Tests   Date/Time Received: Aug 18, 2009 1:09 PM  Date/Time Reported: Aug 18, 2009 1:08 PM   PT: 18.4 s   (Normal Range: 10.6-13.4)  INR: 2.3   (Normal Range: 0.88-1.12   Therap INR: 2.0-3.5) Comments: Wynona Canes, CMA  Aug 18, 2009 1:09 PM     Orders Added: 1)  Est. Patient Level I [99211] 2)  Protime [[id number]]  Laboratory Results   Blood Tests     PT: 18.4 s   (Normal Range: 10.6-13.4)  INR: 2.3   (Normal Range: 0.88-1.12   Therap INR: 2.0-3.5) Comments: Wynona Canes, CMA  Aug 18, 2009 1:09 PM       ANTICOAGULATION RECORD PREVIOUS REGIMEN & LAB RESULTS Anticoagulation Diagnosis:  Deep venous thrombosis pulmonary emblilsm on  10/23/2006 Previous INR Goal Range:  2.0-3.0 on  06/23/2009 Previous INR:  1.4 on  08/04/2009 Previous Coumadin Dose(mg):  2.5mg  on tue,thu 5mg  other days on  07/21/2009 Previous Regimen:  5mg . Tue. and Sat all others 5mg . on  08/04/2009 Previous Coagulation Comments:  Approved by Dr. Kirtland Bouchard on  11/04/2008  NEW REGIMEN & LAB RESULTS Current INR: 2.3 Regimen: Same dose       Repeat testing in: 4 weeks MEDICATIONS ALLOPURINOL 300 MG TABS (ALLOPURINOL) Take 1 tablet by mouth once a day WARFARIN SODIUM 5 MG TABS (WARFARIN SODIUM) Take 1 tablet by mouth once a day exceptTues. and Thurs. 2.5 mg ZOCOR 40 MG  TABS (SIMVASTATIN) 1 once daily BABY ASPIRIN 81 MG  CHEW (ASPIRIN) 1QD VITAMIN C 1000 MG  TABS (ASCORBIC ACID) once daily HYDROCODONE-ACETAMINOPHEN 5-325 MG TABS (HYDROCODONE-ACETAMINOPHEN) 1 by mouth up to 4 times per day as needed for pain TYLENOL ARTHRITIS PAIN 650 MG  CR-TABS (ACETAMINOPHEN) as needed * VITAMIN C daily METOPROLOL SUCCINATE 25 MG XR24H-TAB (METOPROLOL SUCCINATE) Take onehalf  tablet two times a day NITROGLYCERIN 0.4 MG SUBL (NITROGLYCERIN) One tablet under tongue every 5 minutes as needed for chest  pain---may repeat times three FERROUS SULFATE 325 (65 FE) MG TABS (FERROUS SULFATE) Take 1 tablet by mouth two times a day   Anticoagulation Visit Questionnaire      Coumadin dose missed/changed:  No      Abnormal Bleeding Symptoms:  No   Any diet changes including alcohol intake, vegetables or greens since the last visit:  No Any illnesses or hospitalizations since the last visit:  No Any signs of clotting since the last visit (including chest discomfort, dizziness, shortness of breath, arm tingling, slurred speech, swelling or redness in leg):  No

## 2010-05-06 NOTE — Assessment & Plan Note (Signed)
Summary: PT  Nurse Visit   Allergies: 1)  Morphine Sulfate (Morphine Sulfate) Laboratory Results   Blood Tests      INR: 1.9   (Normal Range: 0.88-1.12   Therap INR: 2.0-3.5) Comments: Rita Ohara  February 05, 2010 10:49 AM     Orders Added: 1)  Est. Patient Level I [99211] 2)  Protime [[id number]]   ANTICOAGULATION RECORD PREVIOUS REGIMEN & LAB RESULTS Anticoagulation Diagnosis:  Deep venous thrombosis pulmonary emblilsm on  10/23/2006 Previous INR Goal Range:  2.0-3.0 on  06/23/2009 Previous INR:  1.5 on  01/22/2010 Previous Coumadin Dose(mg):  2.5mg  on tue,thu 5mg  other days on  07/21/2009 Previous Regimen:  5mg . QD except Thurs. 2.5mg . on  01/22/2010 Previous Coagulation Comments:  Dr. Lovell Sheehan approved on  10/12/2009  NEW REGIMEN & LAB RESULTS Current INR: 1.9 Regimen: same  Repeat testing in: 2 weeks  Anticoagulation Visit Questionnaire Coumadin dose missed/changed:  No Abnormal Bleeding Symptoms:  No  Any diet changes including alcohol intake, vegetables or greens since the last visit:  No Any illnesses or hospitalizations since the last visit:  No Any signs of clotting since the last visit (including chest discomfort, dizziness, shortness of breath, arm tingling, slurred speech, swelling or redness in leg):  No  MEDICATIONS ALLOPURINOL 300 MG TABS (ALLOPURINOL) Take 1 tablet by mouth once a day WARFARIN SODIUM 5 MG TABS (WARFARIN SODIUM) Take 1 tablet by mouth once a day exceptTues. and Thurs. 2.5 mg ZOCOR 40 MG  TABS (SIMVASTATIN) 1 once daily BABY ASPIRIN 81 MG  CHEW (ASPIRIN) 1QD TYLENOL ARTHRITIS PAIN 650 MG  CR-TABS (ACETAMINOPHEN) as needed VITAMIN C 1000 MG TABS (ASCORBIC ACID) once daily METOPROLOL TARTRATE 25 MG TABS (METOPROLOL TARTRATE) 1/2 by mouth two times a day NITROGLYCERIN 0.4 MG SUBL (NITROGLYCERIN) One tablet under tongue every 5 minutes as needed for chest pain---may repeat times three HYDROCODONE-ACETAMINOPHEN 2.5-500 MG TABS  (HYDROCODONE-ACETAMINOPHEN) as needed

## 2010-05-06 NOTE — Assessment & Plan Note (Signed)
Summary: pt/[name]  Nurse Visit   Allergies: 1)  Morphine Sulfate (Morphine Sulfate) Laboratory Results   Blood Tests   Date/Time Received: January 07, 2010 11:09 AM  Date/Time Reported: January 07, 2010 11:09 AM    INR: 1.6   (Normal Range: 0.88-1.12   Therap INR: 2.0-3.5) Comments: Wynona Canes, CMA  January 07, 2010 11:09 AM     Orders Added: 1)  Est. Patient Level I [99211] 2)  Protime [78295AO]  Laboratory Results   Blood Tests      INR: 1.6   (Normal Range: 0.88-1.12   Therap INR: 2.0-3.5) Comments: Wynona Canes, CMA  January 07, 2010 11:09 AM       ANTICOAGULATION RECORD PREVIOUS REGIMEN & LAB RESULTS Anticoagulation Diagnosis:  Deep venous thrombosis pulmonary emblilsm on  10/23/2006 Previous INR Goal Range:  2.0-3.0 on  06/23/2009 Previous INR:  2.7 on  12/10/2009 Previous Coumadin Dose(mg):  2.5mg  on tue,thu 5mg  other days on  07/21/2009 Previous Regimen:  same on  12/10/2009 Previous Coagulation Comments:  Dr. Lovell Sheehan approved on  10/12/2009  NEW REGIMEN & LAB RESULTS Current INR: 1.6 Regimen: 2.5mg  on m,w,f 5mg  on other days       Repeat testing in: 4 weeks MEDICATIONS ALLOPURINOL 300 MG TABS (ALLOPURINOL) Take 1 tablet by mouth once a day WARFARIN SODIUM 5 MG TABS (WARFARIN SODIUM) Take 1 tablet by mouth once a day exceptTues. and Thurs. 2.5 mg ZOCOR 40 MG  TABS (SIMVASTATIN) 1 once daily BABY ASPIRIN 81 MG  CHEW (ASPIRIN) 1QD TYLENOL ARTHRITIS PAIN 650 MG  CR-TABS (ACETAMINOPHEN) as needed VITAMIN C 1000 MG TABS (ASCORBIC ACID) once daily METOPROLOL TARTRATE 25 MG TABS (METOPROLOL TARTRATE) 1/2 by mouth two times a day NITROGLYCERIN 0.4 MG SUBL (NITROGLYCERIN) One tablet under tongue every 5 minutes as needed for chest pain---may repeat times three HYDROCODONE-ACETAMINOPHEN 2.5-500 MG TABS (HYDROCODONE-ACETAMINOPHEN) as needed   Anticoagulation Visit Questionnaire      Coumadin dose missed/changed:  No      Abnormal Bleeding  Symptoms:  No   Any diet changes including alcohol intake, vegetables or greens since the last visit:  No Any illnesses or hospitalizations since the last visit:  No Any signs of clotting since the last visit (including chest discomfort, dizziness, shortness of breath, arm tingling, slurred speech, swelling or redness in leg):  No

## 2010-05-06 NOTE — Assessment & Plan Note (Signed)
Summary: [name]      Allergies Added:   Visit Type:  Follow-up Primary Provider:  Birdie Sons MD  CC:  Pt is now in [place] with wife since dec.No cardiac complaints.  History of Present Illness: The patient is 77 years old this time for management of CAD. In 1987 he had an anterior MI treated with TPA and then PTCA of the LAD. In 1994 he had a stent to the right coronary artery. In June of 2010 he had a drug-eluting stent placed to the circumflex artery. His ejection fraction was 34% by Myoview in 2008 and 45% by echo in March of 2009.  He just moved to [place]. He's been doing well with his heart with no chest pain shortness of breath palpitations or swelling.  His past medical history is significant for hypertension, hyperlipidemia, TIA, and deep vein, phlebitis and pulmonary embolism treated with long-term Coumadin.  Current Medications (verified): 1)  Allopurinol 300 Mg Tabs (Allopurinol) .... Take 1 Tablet By Mouth Once A Day 2)  Warfarin Sodium 5 Mg Tabs (Warfarin Sodium) .... Take 1 Tablet By Mouth Once A Day Excepttues. and Thurs. 2.5 Mg 3)  Zocor 40 Mg  Tabs (Simvastatin) .Marland Kitchen.. 1 Once Daily 4)  Baby Aspirin 81 Mg  Chew (Aspirin) .Marland Kitchen.. 1qd 5)  Vitamin C 1000 Mg  Tabs (Ascorbic Acid) .... Once Daily 6)  Hydrocodone-Acetaminophen 5-325 Mg Tabs (Hydrocodone-Acetaminophen) .Marland Kitchen.. 1 By Mouth Up To 4 Times Per Day As Needed For Pain 7)  Tylenol Arthritis Pain 650 Mg  Cr-Tabs (Acetaminophen) .... As Needed 8)  Plavix 75 Mg Tabs (Clopidogrel Bisulfate) .... Take One Tablet By Mouth Daily 9)  Vitamin C .... Daily 10)  Metoprolol Succinate 25 Mg Xr24h-Tab (Metoprolol Succinate) .... Take Onehalf  Tablet Two Times A Day 11)  Nitroglycerin 0.4 Mg Subl (Nitroglycerin) .... One Tablet Under Tongue Every 5 Minutes As Needed For Chest Pain---May Repeat Times Three 12)  Doxycycline Hyclate 100 Mg Tabs (Doxycycline Hyclate) .... One By Mouth Two Times A Day X 7 Days  Allergies (verified): 1)   Morphine Sulfate (Morphine Sulfate)  Past History:  Past Medical History: Reviewed history from 10/05/2008 and no changes required. Coronary artery disease Myocardial infarction, hx of Peptic ulcer disease benign kidney tumor-nephrectomy Hyperlipidemia Hypertension Pulmonary embolism, hx of-protein S deficiency chronic back pain Congestive heart failure-EF 37%, repeat echo 55% Anemia-NOS 1. Coronary artery disease status post remote anterior wall myocardial     infarction treated with streptokinase and subsequent percutaneous     transluminal coronary angioplasty with subsequent stenting of the     right coronary artery in 1994 and DES to CFX 09/2008. 2. Ischemic cardiomyopathy with ejection fraction of 34% by Myoview in     2008, but 45% by echo in March 2009. 3. History of deep vein thrombophlebitis. 4. History of transient ischemic attack. 5. Hypertension. 6. Hyperlipidemia. 7. History of anemia with negative gastrointestinal workup. 8. Intolerance to Coreg due to hypotension.   Review of Systems       ROS is negative except as outlined in HPI.   Vital Signs:  Patient profile:   78 year old male Height:      66 inches Weight:      166 pounds Pulse rate:   60 / minute BP sitting:   134 / 74  (left arm) Cuff size:   regular  Vitals Entered By: Burnett Kanaris, CNA (June 02, 2009 9:26 AM)  Physical Exam  Additional Exam:  Gen. Well-nourished,  in no distress   Neck: No JVD, thyroid not enlarged, no carotid bruits Lungs: No tachypnea, clear without rales, rhonchi or wheezes Cardiovascular: Rhythm regular, PMI not displaced,  heart sounds  normal, no murmurs or gallops, no peripheral edema, pulses normal in all 4 extremities. Abdomen: BS normal, abdomen soft and non-tender without masses or organomegaly, no hepatosplenomegaly. MS: No deformities, no cyanosis or clubbing   Neuro:  No focal sns   Skin:  no lesions    Impression & Recommendations:  Problem # 1:   CORONARY ARTERY DISEASE (ICD-414.00) He has had a previous anterior wall MI and has had PCI of all 3 vessels. The last was a drug stent to the circumflex with artery in June of 2010. He has had no chest pain this problem appears stable. He is currently on Coumadin aspirin and Plavix. I think at this point the risk of continuing all 3 outweighs the benefit and will plan to stop his Plavix. His updated medication list for this problem includes:    Warfarin Sodium 5 Mg Tabs (Warfarin sodium) .Marland Kitchen... Take 1 tablet by mouth once a day excepttues. and thurs. 2.5 mg    Baby Aspirin 81 Mg Chew (Aspirin) .Marland Kitchen... 1qd    Plavix 75 Mg Tabs (Clopidogrel bisulfate) .Marland Kitchen... Take one tablet by mouth daily    Metoprolol Succinate 25 Mg Xr24h-tab (Metoprolol succinate) .Marland Kitchen... Take onehalf  tablet two times a day    Nitroglycerin 0.4 Mg Subl (Nitroglycerin) ..... One tablet under tongue every 5 minutes as needed for chest pain---may repeat times three  The following medications were removed from the medication list:    Plavix 75 Mg Tabs (Clopidogrel bisulfate) .Marland Kitchen... Take one tablet by mouth daily His updated medication list for this problem includes:    Warfarin Sodium 5 Mg Tabs (Warfarin sodium) .Marland Kitchen... Take 1 tablet by mouth once a day excepttues. and thurs. 2.5 mg    Baby Aspirin 81 Mg Chew (Aspirin) .Marland Kitchen... 1qd    Metoprolol Succinate 25 Mg Xr24h-tab (Metoprolol succinate) .Marland Kitchen... Take onehalf  tablet two times a day    Nitroglycerin 0.4 Mg Subl (Nitroglycerin) ..... One tablet under tongue every 5 minutes as needed for chest pain---may repeat times three  Problem # 2:  PULMONARY EMBOLISM, HX OF (ICD-V12.51) He has a history of DVT and pulmonary embolism and is on long-term Coumadin. He had recurrence of this needs to be continued. The following medications were removed from the medication list:    Plavix 75 Mg Tabs (Clopidogrel bisulfate) .Marland Kitchen... Take one tablet by mouth daily His updated medication list for this problem  includes:    Warfarin Sodium 5 Mg Tabs (Warfarin sodium) .Marland Kitchen... Take 1 tablet by mouth once a day excepttues. and thurs. 2.5 mg    Baby Aspirin 81 Mg Chew (Aspirin) .Marland Kitchen... 1qd  Problem # 3:  HYPERTENSION (ICD-401.9) This is well-controlled on current medications. His updated medication list for this problem includes:    Baby Aspirin 81 Mg Chew (Aspirin) .Marland Kitchen... 1qd    Metoprolol Succinate 25 Mg Xr24h-tab (Metoprolol succinate) .Marland Kitchen... Take onehalf  tablet two times a day  Problem # 4:  CONGESTIVE HEART FAILURE (ICD-428.0) He has systolic dysfunction with ejection fractions of 34% and 45% by last measurement. He is euvolemic today and this problem appears compensated. The following medications were removed from the medication list:    Plavix 75 Mg Tabs (Clopidogrel bisulfate) .Marland Kitchen... Take one tablet by mouth daily His updated medication list for this problem includes:  Warfarin Sodium 5 Mg Tabs (Warfarin sodium) .Marland Kitchen... Take 1 tablet by mouth once a day excepttues. and thurs. 2.5 mg    Baby Aspirin 81 Mg Chew (Aspirin) .Marland Kitchen... 1qd    Metoprolol Succinate 25 Mg Xr24h-tab (Metoprolol succinate) .Marland Kitchen... Take onehalf  tablet two times a day    Nitroglycerin 0.4 Mg Subl (Nitroglycerin) ..... One tablet under tongue every 5 minutes as needed for chest pain---may repeat times three  Other Orders: EKG w/ Interpretation (93000)  Patient Instructions: 1)  Your physician wants you to follow-up in: 6 months. You will receive a reminder letter in the mail two months in advance. If you don't receive a letter, please call our office to schedule the follow-up appointment. 2)  Your physician has recommended you make the following change in your medication: 1) Stop Plavix.

## 2010-05-06 NOTE — Assessment & Plan Note (Signed)
Summary: ? DVT/dm   Vital Signs:  Patient profile:   77 year old male Weight:      169 pounds Temp:     98.6 degrees F oral BP sitting:   118 / 70  (right arm) Cuff size:   regular  Vitals Entered By: Duard Brady LPN (October 19, 2009 2:43 PM) CC: c/o (L) calf pain - hx clots Is Patient Diabetic? No   Primary Care Provider:  Birdie Sons MD  CC:  c/o (L) calf pain - hx clots.  History of Present Illness: 77 year old patient who has a history of chronic venoocclusive disease, who was hospitalized two weeks ago.  At that time he presented with left calf pain and was felt to have chronic thrombotic disease, but no acute clot.  D-dimer was negative.  He has had a thrombophilia workup and has been on chronic Coumadin since 2003.  He complains of worsening left calf pain.  There has been no swelling, and no pulmonary complaints.  Last week his INR was sub-therapeutic and he is now taking Coumadin 5 mg daily.  Allergies: 1)  Morphine Sulfate (Morphine Sulfate)  Past History:  Past Medical History: Reviewed history from 10/05/2008 and no changes required. Coronary artery disease Myocardial infarction, hx of Peptic ulcer disease benign kidney tumor-nephrectomy Hyperlipidemia Hypertension Pulmonary embolism, hx of-protein S deficiency chronic back pain Congestive heart failure-EF 37%, repeat echo 55% Anemia-NOS 1. Coronary artery disease status post remote anterior wall myocardial     infarction treated with streptokinase and subsequent percutaneous     transluminal coronary angioplasty with subsequent stenting of the     right coronary artery in 1994 and DES to CFX 09/2008. 2. Ischemic cardiomyopathy with ejection fraction of 34% by Myoview in     2008, but 45% by echo in March 2009. 3. History of deep vein thrombophlebitis. 4. History of transient ischemic attack. 5. Hypertension. 6. Hyperlipidemia. 7. History of anemia with negative gastrointestinal workup. 8.  Intolerance to Coreg due to hypotension.   Physical Exam  General:  Well-developed,well-nourished,in no acute distress; alert,appropriate and cooperative throughout examination; normal blood pressure Extremities:  there is no edema.  There is very mild left calf tenderness; varicosities were were present, involving both the lower legs.  No popliteal pain or tenderness   Impression & Recommendations:  Problem # 1:  ENCOUNTER FOR THERAPEUTIC DRUG MONITORING (ICD-V58.83)  Orders: Protime (16109UE)  Problem # 2:  DEEP VENOUS THROMBOPHLEBITIS, CHRONIC (ICD-453.40)  Complete Medication List: 1)  Allopurinol 300 Mg Tabs (Allopurinol) .... Take 1 tablet by mouth once a day 2)  Warfarin Sodium 5 Mg Tabs (Warfarin sodium) .... Take 1 tablet by mouth once a day excepttues. and thurs. 2.5 mg 3)  Zocor 40 Mg Tabs (Simvastatin) .Marland Kitchen.. 1 once daily 4)  Baby Aspirin 81 Mg Chew (Aspirin) .Marland Kitchen.. 1qd 5)  Hydrocodone-acetaminophen 5-325 Mg Tabs (Hydrocodone-acetaminophen) .Marland Kitchen.. 1 by mouth up to 4 times per day as needed for pain 6)  Tylenol Arthritis Pain 650 Mg Cr-tabs (Acetaminophen) .... As needed 7)  Vitamin C 1000 Mg Tabs (Ascorbic acid) .... Once daily 8)  Metoprolol Tartrate 25 Mg Tabs (Metoprolol tartrate) .... 1/2 by mouth two times a day 9)  Nitroglycerin 0.4 Mg Subl (Nitroglycerin) .... One tablet under tongue every 5 minutes as needed for chest pain---may repeat times three 10)  Ferrous Sulfate 325 (65 Fe) Mg Tabs (Ferrous sulfate) .... Take 1 tablet by mouth two times a day 11)  Tramadol Hcl 50 Mg Tabs (Tramadol  hcl) .... One every 6 hours as needed for pain  Patient Instructions: 1)  elevate left leg as much as possible 2)  warm compresses to the left calf as needed 3)  continue Coumadin therapy 4)  notified if there is any increasing left leg pain, swelling, redness, or shortness of breath Prescriptions: TRAMADOL HCL 50 MG TABS (TRAMADOL HCL) one every 6 hours as needed for pain  #50 x 6    Entered and Authorized by:   Gordy Savers  MD   Signed by:   Gordy Savers  MD on 10/19/2009   Method used:   Electronically to        [facility] [address] [phone]* (retail)       [address]       [place], [place]  [address]       Ph: [phone] or [phone]       Fax: [phone]   RxID:   [id number]   Appended Document: Orders Update     Clinical Lists Changes  Orders: Added new Service order of Fingerstick [id number]) - Signed Observations: Added new observation of ABNORM BLEED: No (10/19/2009 15:57) Added new observation of COUMADIN CHG: No (10/19/2009 15:57) Added new observation of CUR. REGIMEN: 5mg  qd (10/19/2009 15:57) Added new observation of COMMENTS2: Wynona Canes, CMA  October 19, 2009 3:58 PM  (10/19/2009 15:57) Added new observation of INR: 1.9  (10/19/2009 15:57)      Laboratory Results   Blood Tests   Date/Time Recieved: October 19, 2009 3:58 PM  Date/Time Reported: October 19, 2009 3:58 PM    INR: 1.9   (Normal Range: 0.88-1.12   Therap INR: 2.0-3.5) Comments: Wynona Canes, CMA  October 19, 2009 3:58 PM       ANTICOAGULATION RECORD PREVIOUS REGIMEN & LAB RESULTS Anticoagulation Diagnosis:  Deep venous thrombosis pulmonary emblilsm on  10/23/2006 Previous INR Goal Range:  2.0-3.0 on  06/23/2009 Previous INR:  1.6 on  10/12/2009 Previous Coumadin Dose(mg):  2.5mg  on tue,thu 5mg  other days on  07/21/2009 Previous Regimen:  5mg . M, T, W, TH, Fri. then resume normal dose on  10/12/2009 Previous Coagulation Comments:  Dr. Lovell Sheehan approved on  10/12/2009  NEW REGIMEN & LAB RESULTS Current INR: 1.9 Regimen: 5mg  qd  MEDICATIONS ALLOPURINOL 300 MG TABS (ALLOPURINOL) Take 1 tablet by mouth once a day WARFARIN SODIUM 5 MG TABS (WARFARIN SODIUM) Take 1 tablet by mouth once a day exceptTues. and Thurs. 2.5 mg ZOCOR 40 MG  TABS (SIMVASTATIN) 1 once daily BABY ASPIRIN 81 MG  CHEW (ASPIRIN)  1QD HYDROCODONE-ACETAMINOPHEN 5-325 MG TABS (HYDROCODONE-ACETAMINOPHEN) 1 by mouth up to 4 times per day as needed for pain TYLENOL ARTHRITIS PAIN 650 MG  CR-TABS (ACETAMINOPHEN) as needed VITAMIN C 1000 MG TABS (ASCORBIC ACID) once daily METOPROLOL TARTRATE 25 MG TABS (METOPROLOL TARTRATE) 1/2 by mouth two times a day NITROGLYCERIN 0.4 MG SUBL (NITROGLYCERIN) One tablet under tongue every 5 minutes as needed for chest pain---may repeat times three FERROUS SULFATE 325 (65 FE) MG TABS (FERROUS SULFATE) Take 1 tablet by mouth two times a day TRAMADOL HCL 50 MG TABS (TRAMADOL HCL) one every 6 hours as needed for pain   Anticoagulation Visit Questionnaire      Coumadin dose missed/changed:  No      Abnormal Bleeding Symptoms:  No   Any diet changes including alcohol intake, vegetables or greens since the last visit:  No Any  illnesses or hospitalizations since the last visit:  No Any signs of clotting since the last visit (including chest discomfort, dizziness, shortness of breath, arm tingling, slurred speech, swelling or redness in leg):  No

## 2010-05-06 NOTE — Assessment & Plan Note (Signed)
Summary: R SIDE PAIN // RS   Vital Signs:  Patient profile:   77 year old male Pulse rate:   90 / minute Pulse rhythm:   irregular Resp:     14 per minute BP sitting:   104 / 66  (left arm) Cuff size:   regular  Vitals Entered By: Gladis Riffle, RN (September 03, 2009 1:16 PM) CC: RLQ pain Is Patient Diabetic? No   Primary Care Provider:  Birdie Sons MD  CC:  RLQ pain.  History of Present Illness: RLQ and hip pain---only hurts when changing position from lying to sitting and sitting to lying.  appetite normal/BMs normal no fever or chills, no sweats pain can be intense duration 2 weeks. pain is better now than it was 1 week ago.   All other systems reviewed and were negative   Preventive Screening-Counseling & Management  Alcohol-Tobacco     Smoking Status: never  Current Problems (verified): 1)  Renal Insufficiency  (ICD-588.9) 2)  Back Pain  (ICD-724.5) 3)  Anemia-nos  (ICD-285.9) 4)  Aftercare, Long-term Use, Anticoagulants  (ICD-V58.61) 5)  Congestive Heart Failure  (ICD-428.0) 6)  Pulmonary Embolism, Hx of  (ICD-V12.51) 7)  Hypertension  (ICD-401.9) 8)  Hyperlipidemia  (ICD-272.4) 9)  Peptic Ulcer Disease  (ICD-533.90) 10)  Myocardial Infarction, Hx of  (ICD-412) 11)  Coronary Artery Disease  (ICD-414.00)  Current Medications (verified): 1)  Allopurinol 300 Mg Tabs (Allopurinol) .... Take 1 Tablet By Mouth Once A Day 2)  Warfarin Sodium 5 Mg Tabs (Warfarin Sodium) .... Take 1 Tablet By Mouth Once A Day Excepttues. and Thurs. 2.5 Mg 3)  Zocor 40 Mg  Tabs (Simvastatin) .Marland Kitchen.. 1 Once Daily 4)  Baby Aspirin 81 Mg  Chew (Aspirin) .Marland Kitchen.. 1qd 5)  Vitamin C 1000 Mg  Tabs (Ascorbic Acid) .... Once Daily 6)  Hydrocodone-Acetaminophen 5-325 Mg Tabs (Hydrocodone-Acetaminophen) .Marland Kitchen.. 1 By Mouth Up To 4 Times Per Day As Needed For Pain 7)  Tylenol Arthritis Pain 650 Mg  Cr-Tabs (Acetaminophen) .... As Needed 8)  Vitamin C 1000 Mg Tabs (Ascorbic Acid) .... Once Daily 9)   Metoprolol Tartrate 25 Mg Tabs (Metoprolol Tartrate) .... 1/2 By Mouth Two Times A Day 10)  Nitroglycerin 0.4 Mg Subl (Nitroglycerin) .... One Tablet Under Tongue Every 5 Minutes As Needed For Chest Pain---May Repeat Times Three 11)  Ferrous Sulfate 325 (65 Fe) Mg Tabs (Ferrous Sulfate) .... Take 1 Tablet By Mouth Two Times A Day  Allergies: 1)  Morphine Sulfate (Morphine Sulfate)  Past History:  Past Medical History: Last updated: 10/05/2008 Coronary artery disease Myocardial infarction, hx of Peptic ulcer disease benign kidney tumor-nephrectomy Hyperlipidemia Hypertension Pulmonary embolism, hx of-protein S deficiency chronic back pain Congestive heart failure-EF 37%, repeat echo 55% Anemia-NOS 1. Coronary artery disease status post remote anterior wall myocardial     infarction treated with streptokinase and subsequent percutaneous     transluminal coronary angioplasty with subsequent stenting of the     right coronary artery in 1994 and DES to CFX 09/2008. 2. Ischemic cardiomyopathy with ejection fraction of 34% by Myoview in     2008, but 45% by echo in March 2009. 3. History of deep vein thrombophlebitis. 4. History of transient ischemic attack. 5. Hypertension. 6. Hyperlipidemia. 7. History of anemia with negative gastrointestinal workup. 8. Intolerance to Coreg due to hypotension.   Past Surgical History: Last updated: 08/24/2006 Appendectomy Cataract extraction Gastrectomy Nephrectomy Percutaneous transluminal coronary angioplasty  Social History: Last updated: 01/12/2007 Never Smoked Married  Regular exercise-no  Risk Factors: Exercise: no (01/12/2007)  Risk Factors: Smoking Status: never (09/03/2009)  Physical Exam  General:  Well-developed,well-nourished,in no acute distress; alert,appropriate and cooperative throughout examination Head:  normocephalic and atraumatic.   Eyes:  pupils equal and pupils round.   Ears:  R ear normal and L ear normal.     Lungs:  normal respiratory effort and no intercostal retractions.   Heart:  normal rate and regular rhythm.   Abdomen:  soft and normal bowel sounds.   some pain to deep palpation of RLQ no mass, guarding or rebound Msk:  reproduction of pain with flexion of hip and internal and external rotation of hip   Impression & Recommendations:  Problem # 1:  ABDOMINAL PAIN (ICD-789.00)  RLQ and hip pain I think this is more mechanical then an intra-abdominal process.  will check labs if sxs persist may need imaging.   Orders: Venipuncture (95621) UA Dipstick w/o Micro (automated)  (81003) TLB-BMP (Basic Metabolic Panel-BMET) (80048-METABOL) TLB-CBC Platelet - w/Differential (85025-CBCD) TLB-Hepatic/Liver Function Pnl (80076-HEPATIC)  Complete Medication List: 1)  Allopurinol 300 Mg Tabs (Allopurinol) .... Take 1 tablet by mouth once a day 2)  Warfarin Sodium 5 Mg Tabs (Warfarin sodium) .... Take 1 tablet by mouth once a day excepttues. and thurs. 2.5 mg 3)  Zocor 40 Mg Tabs (Simvastatin) .Marland Kitchen.. 1 once daily 4)  Baby Aspirin 81 Mg Chew (Aspirin) .Marland Kitchen.. 1qd 5)  Vitamin C 1000 Mg Tabs (Ascorbic acid) .... Once daily 6)  Hydrocodone-acetaminophen 5-325 Mg Tabs (Hydrocodone-acetaminophen) .Marland Kitchen.. 1 by mouth up to 4 times per day as needed for pain 7)  Tylenol Arthritis Pain 650 Mg Cr-tabs (Acetaminophen) .... As needed 8)  Vitamin C 1000 Mg Tabs (Ascorbic acid) .... Once daily 9)  Metoprolol Tartrate 25 Mg Tabs (Metoprolol tartrate) .... 1/2 by mouth two times a day 10)  Nitroglycerin 0.4 Mg Subl (Nitroglycerin) .... One tablet under tongue every 5 minutes as needed for chest pain---may repeat times three 11)  Ferrous Sulfate 325 (65 Fe) Mg Tabs (Ferrous sulfate) .... Take 1 tablet by mouth two times a day  Laboratory Results   Urine Tests  Date/Time Recieved: September 03, 2009 2:26 PM  Date/Time Reported: September 03, 2009 2:26 PM   Routine Urinalysis   Color: yellow Appearance:  Clear Glucose: negative   (Normal Range: Negative) Bilirubin: negative   (Normal Range: Negative) Ketone: negative   (Normal Range: Negative) Spec. Gravity: 1.025   (Normal Range: 1.003-1.035) Blood: negative   (Normal Range: Negative) pH: 5.0   (Normal Range: 5.0-8.0) Protein: 2+   (Normal Range: Negative) Urobilinogen: 0.2   (Normal Range: 0-1) Nitrite: negative   (Normal Range: Negative) Leukocyte Esterace: negative   (Normal Range: Negative)    Comments: Wynona Canes, CMA  September 03, 2009 2:26 PM

## 2010-05-06 NOTE — Assessment & Plan Note (Signed)
Summary: 4 MNTH ROV//SLM--add pt//ccm   Vital Signs:  Patient profile:   77 year old male Weight:      164 pounds Temp:     97.8 degrees F oral Pulse rate:   102 / minute BP sitting:   110 / 80  (left arm) Cuff size:   regular  Vitals Entered By: Kathrynn Speed CMA (October 28, 2009 10:47 AM) CC: 4 mth rov, src   Primary Care Provider:  Birdie Sons MD  CC:  4 mth rov and src.  History of Present Illness:  Follow-Up Visit: pt here with son      This is an 77 year old man who presents for Follow-up visit.  The patient denies chest pain and palpitations.  Since the last visit the patient notes no new problems or concerns--had an episode constipation---resolved with miralax.  The patient reports taking meds as prescribed.  When questioned about possible medication side effects, the patient notes none.    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... Take 1 Tablet By Mouth Once A Day 2)  Warfarin Sodium 5 Mg Tabs (Warfarin Sodium) .... Take 1 Tablet By Mouth Once A Day Excepttues. and Thurs. 2.5 Mg 3)  Zocor 40 Mg  Tabs (Simvastatin) .Marland Kitchen.. 1 Once Daily 4)  Baby Aspirin 81 Mg  Chew (Aspirin) .Marland Kitchen.. 1qd 5)  Tylenol Arthritis Pain 650 Mg  Cr-Tabs (Acetaminophen) .... As Needed 6)  Vitamin C 1000 Mg Tabs  (Ascorbic Acid) .... Once Daily 7)  Metoprolol Tartrate 25 Mg Tabs (Metoprolol Tartrate) .... 1/2 By Mouth Two Times A Day 8)  Nitroglycerin 0.4 Mg Subl (Nitroglycerin) .... One Tablet Under Tongue Every 5 Minutes As Needed For Chest Pain---May Repeat Times Three 9)  Tramadol Hcl 50 Mg Tabs (Tramadol Hcl) .... One Every 6 Hours As Needed For Pain  Allergies (verified): 1)  Morphine Sulfate (Morphine Sulfate)  Past History:  Past Medical History: Last updated: 10/05/2008 Coronary artery disease Myocardial infarction, hx of Peptic ulcer disease benign kidney tumor-nephrectomy Hyperlipidemia Hypertension Pulmonary embolism, hx of-protein S deficiency chronic back pain Congestive heart failure-EF 37%, repeat echo 55% Anemia-NOS 1. Coronary artery disease status post remote anterior wall myocardial     infarction treated with streptokinase and subsequent percutaneous     transluminal coronary angioplasty with subsequent stenting of the     right coronary artery in 1994 and DES to CFX 09/2008. 2. Ischemic cardiomyopathy with ejection fraction of 34% by Myoview in     2008, but 45% by echo in March 2009. 3. History of deep vein thrombophlebitis. 4. History of transient ischemic attack. 5. Hypertension. 6. Hyperlipidemia. 7. History of anemia with negative gastrointestinal workup. 8. Intolerance to Coreg due to hypotension.   Past Surgical History: Last updated: 08/24/2006 Appendectomy Cataract extraction Gastrectomy Nephrectomy Percutaneous transluminal coronary angioplasty  Social History: Last updated:  01/12/2007 Never Smoked Married Regular exercise-no  Risk Factors: Exercise: no (01/12/2007)  Risk Factors: Smoking Status: never (09/03/2009)  Physical Exam  General:  alert and well-developed.   Head:  normocephalic and atraumatic.   Eyes:  pupils equal and pupils round.   Ears:  R ear normal and L ear normal.   Neck:  No deformities, masses, or tenderness  noted. Lungs:  normal respiratory effort and no intercostal retractions.   Heart:  normal rate and regular rhythm.   Abdomen:  soft and non-tender.   Msk:  No deformity or scoliosis noted of thoracic or lumbar spine.   Neurologic:  cranial nerves II-XII intact , gait--walks with cane   Impression & Recommendations:  Problem # 1:  HYPERTENSION (ICD-401.9) controlled continue current medications  His updated medication list for this problem includes:    Metoprolol Tartrate 25 Mg Tabs (Metoprolol tartrate) .Marland Kitchen... 1/2 by mouth two times a day  BP today: 110/80 Prior BP: 118/70 (10/19/2009)  Labs Reviewed: K+: 4.2 (09/03/2009) Creat: : 1.7 (09/03/2009)   Chol: 141 (06/23/2009)   HDL: 47.90 (06/23/2009)   LDL: 71 (06/23/2009)   TG: 110.0 (06/23/2009)  Problem # 2:  HYPERLIPIDEMIA (ICD-272.4)  controlled continue current medications  His updated medication list for this problem includes:    Zocor 40 Mg Tabs (Simvastatin) .Marland Kitchen... 1 once daily  Labs Reviewed: SGOT: 23 (09/03/2009)   SGPT: 21 (09/03/2009)   HDL:47.90 (06/23/2009), 42.6 (04/09/2008)  LDL:71 (06/23/2009), 76 (04/09/2008)  Chol:141 (06/23/2009), 144 (04/09/2008)  Trig:110.0 (06/23/2009), 128 (04/09/2008)  Problem # 3:  DEEP VENOUS THROMBOPHLEBITIS, CHRONIC (ICD-453.40) recurrent life long warfarin Orders: Protime (16109UE) Fingerstick (45409)  check protime in two weeks and then will likely be ale to return to monthly  Complete Medication List: 1)  Allopurinol 300 Mg Tabs (Allopurinol) .... Take 1 tablet by mouth once a day 2)  Warfarin Sodium 5 Mg Tabs (Warfarin sodium) .... Take 1 tablet by mouth once a day excepttues. and thurs. 2.5 mg 3)  Zocor 40 Mg Tabs (Simvastatin) .Marland Kitchen.. 1 once daily 4)  Baby Aspirin 81 Mg Chew (Aspirin) .Marland Kitchen.. 1qd 5)  Tylenol Arthritis Pain 650 Mg Cr-tabs (Acetaminophen) .... As needed 6)  Vitamin C 1000 Mg Tabs (Ascorbic acid) .... Once daily 7)  Metoprolol Tartrate 25 Mg Tabs (Metoprolol  tartrate) .... 1/2 by mouth two times a day 8)  Nitroglycerin 0.4 Mg Subl (Nitroglycerin) .... One tablet under tongue every 5 minutes as needed for chest pain---may repeat times three 9)  Tramadol Hcl 50 Mg Tabs (Tramadol hcl) .... One every 6 hours as needed for pain  Patient Instructions: 1)  protime in 2 weeks 2)  See me 4 months   ANTICOAGULATION RECORD PREVIOUS REGIMEN & LAB RESULTS Anticoagulation Diagnosis:  Deep venous thrombosis pulmonary emblilsm on  10/23/2006 Previous INR Goal Range:  2.0-3.0 on  06/23/2009 Previous INR:  1.9 on  10/19/2009 Previous Coumadin Dose(mg):  2.5mg  on tue,thu 5mg  other days on  07/21/2009 Previous Regimen:  5mg  qd on  10/19/2009 Previous Coagulation Comments:  Dr. Lovell Sheehan approved on  10/12/2009  NEW REGIMEN & LAB RESULTS Current INR: 2.2 Regimen: 5mg . QD  Repeat testing in: 4 weeks  Anticoagulation Visit Questionnaire Coumadin dose missed/changed:  No Abnormal Bleeding Symptoms:  No  Any diet changes including alcohol intake, vegetables or greens since the last visit:  No Any illnesses or hospitalizations since the last visit:  No Any signs of clotting since the last visit (including chest discomfort, dizziness, shortness of breath,  arm tingling, slurred speech, swelling or redness in leg):  No  MEDICATIONS ALLOPURINOL 300 MG TABS (ALLOPURINOL) Take 1 tablet by mouth once a day WARFARIN SODIUM 5 MG TABS (WARFARIN SODIUM) Take 1 tablet by mouth once a day exceptTues. and Thurs. 2.5 mg ZOCOR 40 MG  TABS (SIMVASTATIN) 1 once daily BABY ASPIRIN 81 MG  CHEW (ASPIRIN) 1QD TYLENOL ARTHRITIS PAIN 650 MG  CR-TABS (ACETAMINOPHEN) as needed VITAMIN C 1000 MG TABS (ASCORBIC ACID) once daily METOPROLOL TARTRATE 25 MG TABS (METOPROLOL TARTRATE) 1/2 by mouth two times a day NITROGLYCERIN 0.4 MG SUBL (NITROGLYCERIN) One tablet under tongue every 5 minutes as needed for chest pain---may repeat times three TRAMADOL HCL 50 MG TABS (TRAMADOL HCL) one  every 6 hours as needed for pain    Laboratory Results   Blood Tests      INR: 2.2   (Normal Range: 0.88-1.12   Therap INR: 2.0-3.5) Comments: Rita Ohara  October 28, 2009 10:50 AM

## 2010-05-06 NOTE — Assessment & Plan Note (Signed)
Summary: 1 month fup//ccm   Vital Signs:  Patient profile:   77 year old male Weight:      170 pounds Temp:     98.7 degrees F oral BP sitting:   124 / 78  (left arm) Cuff size:   regular  Vitals Entered By: Kern Reap CMA Duncan Dull) (June 23, 2009 11:48 AM) CC: follow-up visit Is Patient Diabetic? No   Primary Care Provider:  Birdie Sons MD  CC:  follow-up visit.  History of Present Illness:  Follow-Up Visit      This is an 77 year old man who presents for Follow-up visit.  The patient denies chest pain and palpitations.  Since the last visit the patient notes no new problems or concerns.  The patient reports taking meds as prescribed.  When questioned about possible medication side effects, the patient notes none.    All other systems reviewed and were negative   Current Problems (verified): 1)  Renal Insufficiency  (ICD-588.9) 2)  Back Pain  (ICD-724.5) 3)  Anemia-nos  (ICD-285.9) 4)  Aftercare, Long-term Use, Anticoagulants  (ICD-V58.61) 5)  Congestive Heart Failure  (ICD-428.0) 6)  Pulmonary Embolism, Hx of  (ICD-V12.51) 7)  Hypertension  (ICD-401.9) 8)  Hyperlipidemia  (ICD-272.4) 9)  Peptic Ulcer Disease  (ICD-533.90) 10)  Myocardial Infarction, Hx of  (ICD-412) 11)  Coronary Artery Disease  (ICD-414.00)  Current Medications (verified): 1)  Allopurinol 300 Mg Tabs (Allopurinol) .... Take 1 Tablet By Mouth Once A Day 2)  Warfarin Sodium 5 Mg Tabs (Warfarin Sodium) .... Take 1 Tablet By Mouth Once A Day Excepttues. and Thurs. 2.5 Mg 3)  Zocor 40 Mg  Tabs (Simvastatin) .Marland Kitchen.. 1 Once Daily 4)  Baby Aspirin 81 Mg  Chew (Aspirin) .Marland Kitchen.. 1qd 5)  Vitamin C 1000 Mg  Tabs (Ascorbic Acid) .... Once Daily 6)  Hydrocodone-Acetaminophen 5-325 Mg Tabs (Hydrocodone-Acetaminophen) .Marland Kitchen.. 1 By Mouth Up To 4 Times Per Day As Needed For Pain 7)  Tylenol Arthritis Pain 650 Mg  Cr-Tabs (Acetaminophen) .... As Needed 8)  Vitamin C .... Daily 9)  Metoprolol Succinate 25 Mg Xr24h-Tab  (Metoprolol Succinate) .... Take Onehalf  Tablet Two Times A Day 10)  Nitroglycerin 0.4 Mg Subl (Nitroglycerin) .... One Tablet Under Tongue Every 5 Minutes As Needed For Chest Pain---May Repeat Times Three  Allergies (verified): 1)  Morphine Sulfate (Morphine Sulfate)  Past History:  Past Medical History: Last updated: 10/05/2008 Coronary artery disease Myocardial infarction, hx of Peptic ulcer disease benign kidney tumor-nephrectomy Hyperlipidemia Hypertension Pulmonary embolism, hx of-protein S deficiency chronic back pain Congestive heart failure-EF 37%, repeat echo 55% Anemia-NOS 1. Coronary artery disease status post remote anterior wall myocardial     infarction treated with streptokinase and subsequent percutaneous     transluminal coronary angioplasty with subsequent stenting of the     right coronary artery in 1994 and DES to CFX 09/2008. 2. Ischemic cardiomyopathy with ejection fraction of 34% by Myoview in     2008, but 45% by echo in March 2009. 3. History of deep vein thrombophlebitis. 4. History of transient ischemic attack. 5. Hypertension. 6. Hyperlipidemia. 7. History of anemia with negative gastrointestinal workup. 8. Intolerance to Coreg due to hypotension.   Past Surgical History: Last updated: 08/24/2006 Appendectomy Cataract extraction Gastrectomy Nephrectomy Percutaneous transluminal coronary angioplasty  Social History: Last updated: 01/12/2007 Never Smoked Married Regular exercise-no  Risk Factors: Exercise: no (01/12/2007)  Risk Factors: Smoking Status: never (08/24/2006)  Physical Exam  General:  Well-developed,well-nourished,in no acute distress;  alert,appropriate and cooperative throughout examination Head:  normocephalic and atraumatic.   Eyes:  pupils equal and pupils round.   Lungs:  clear to auscultation Heart:  normal rate and regular rhythm.   Abdomen:  soft and non-tender.   Msk:  walks with a cane. Kyphosis  noted. Pulses:  R radial normal and L radial normal.   Skin:  turgor normal and color normal.   Psych:  normally interactive and good eye contact.     Impression & Recommendations:  Problem # 1:  CONGESTIVE HEART FAILURE (ICD-428.0)  clinically stable  no ACE-I (renal insuff) His updated medication list for this problem includes:    Warfarin Sodium 5 Mg Tabs (Warfarin sodium) .Marland Kitchen... Take 1 tablet by mouth once a day excepttues. and thurs. 2.5 mg    Baby Aspirin 81 Mg Chew (Aspirin) .Marland Kitchen... 1qd    Metoprolol Succinate 25 Mg Xr24h-tab (Metoprolol succinate) .Marland Kitchen... Take onehalf  tablet two times a day  Orders: TLB-CBC Platelet - w/Differential (85025-CBCD)  Problem # 2:  CORONARY ARTERY DISEASE (ICD-414.00) off of plavix His updated medication list for this problem includes:    Baby Aspirin 81 Mg Chew (Aspirin) .Marland Kitchen... 1qd    Metoprolol Succinate 25 Mg Xr24h-tab (Metoprolol succinate) .Marland Kitchen... Take onehalf  tablet two times a day    Nitroglycerin 0.4 Mg Subl (Nitroglycerin) ..... One tablet under tongue every 5 minutes as needed for chest pain---may repeat times three  Problem # 3:  PULMONARY EMBOLISM, HX OF (ICD-V12.51) life long warfarin His updated medication list for this problem includes:    Warfarin Sodium 5 Mg Tabs (Warfarin sodium) .Marland Kitchen... Take 1 tablet by mouth once a day excepttues. and thurs. 2.5 mg    Baby Aspirin 81 Mg Chew (Aspirin) .Marland Kitchen... 1qd  Orders: Protime (16109UE) Fingerstick (45409)  Problem # 4:  BACK PAIN (ICD-724.5) chronic problem His updated medication list for this problem includes:    Baby Aspirin 81 Mg Chew (Aspirin) .Marland Kitchen... 1qd    Hydrocodone-acetaminophen 5-325 Mg Tabs (Hydrocodone-acetaminophen) .Marland Kitchen... 1 by mouth up to 4 times per day as needed for pain    Tylenol Arthritis Pain 650 Mg Cr-tabs (Acetaminophen) .Marland Kitchen... As needed  Complete Medication List: 1)  Allopurinol 300 Mg Tabs (Allopurinol) .... Take 1 tablet by mouth once a day 2)  Warfarin Sodium 5 Mg  Tabs (Warfarin sodium) .... Take 1 tablet by mouth once a day excepttues. and thurs. 2.5 mg 3)  Zocor 40 Mg Tabs (Simvastatin) .Marland Kitchen.. 1 once daily 4)  Baby Aspirin 81 Mg Chew (Aspirin) .Marland Kitchen.. 1qd 5)  Vitamin C 1000 Mg Tabs (Ascorbic acid) .... Once daily 6)  Hydrocodone-acetaminophen 5-325 Mg Tabs (Hydrocodone-acetaminophen) .Marland Kitchen.. 1 by mouth up to 4 times per day as needed for pain 7)  Tylenol Arthritis Pain 650 Mg Cr-tabs (Acetaminophen) .... As needed 8)  Vitamin C  .... Daily 9)  Metoprolol Succinate 25 Mg Xr24h-tab (Metoprolol succinate) .... Take onehalf  tablet two times a day 10)  Nitroglycerin 0.4 Mg Subl (Nitroglycerin) .... One tablet under tongue every 5 minutes as needed for chest pain---may repeat times three 11)  Ferrous Sulfate 325 (65 Fe) Mg Tabs (Ferrous sulfate) .... Take 1 tablet by mouth two times a day  Other Orders: Venipuncture (81191) TLB-BMP (Basic Metabolic Panel-BMET) (80048-METABOL) TLB-Lipid Panel (80061-LIPID) TLB-Hepatic/Liver Function Pnl (80076-HEPATIC) TLB-TSH (Thyroid Stimulating Hormone) (84443-TSH)  Patient Instructions: 1)  see me 4 months 2)  monthly protimes Prescriptions: FERROUS SULFATE 325 (65 FE) MG TABS (FERROUS SULFATE) Take 1 tablet by  mouth two times a day  #180 x 3   Entered by:   Gladis Riffle, RN   Authorized by:   Birdie Sons MD   Signed by:   Gladis Riffle, RN on 06/29/2009   Method used:   Electronically to        [facility]. [address] [phone]* (retail)       [address]       [place], [place]  [address]       Ph: [phone] or [phone]       Fax: [phone]   RxID:   [id number]    ANTICOAGULATION RECORD PREVIOUS REGIMEN & LAB RESULTS Anticoagulation Diagnosis:  Deep venous thrombosis pulmonary emblilsm on  10/23/2006 Previous INR Goal Range:  2.5-3.5 on  10/23/2006 Previous INR:  2.1 on  05/19/2009 Previous Coumadin Dose(mg):  5mg ,5mg ,2.5mg  altinate on  04/09/2008 Previous Regimen:  same dose on   05/19/2009 Previous Coagulation Comments:  Approved by Dr. Kirtland Bouchard on  11/04/2008  NEW REGIMEN & LAB RESULTS Current INR Goal Range: 2.0-3.0 Current INR: 2.1 Regimen: same  Repeat testing in: 1 month  Anticoagulation Visit Questionnaire Coumadin dose missed/changed:  No Abnormal Bleeding Symptoms:  No  Any diet changes including alcohol intake, vegetables or greens since the last visit:  No Any illnesses or hospitalizations since the last visit:  No Any signs of clotting since the last visit (including chest discomfort, dizziness, shortness of breath, arm tingling, slurred speech, swelling or redness in leg):  No  MEDICATIONS ALLOPURINOL 300 MG TABS (ALLOPURINOL) Take 1 tablet by mouth once a day WARFARIN SODIUM 5 MG TABS (WARFARIN SODIUM) Take 1 tablet by mouth once a day exceptTues. and Thurs. 2.5 mg ZOCOR 40 MG  TABS (SIMVASTATIN) 1 once daily BABY ASPIRIN 81 MG  CHEW (ASPIRIN) 1QD VITAMIN C 1000 MG  TABS (ASCORBIC ACID) once daily HYDROCODONE-ACETAMINOPHEN 5-325 MG TABS (HYDROCODONE-ACETAMINOPHEN) 1 by mouth up to 4 times per day as needed for pain TYLENOL ARTHRITIS PAIN 650 MG  CR-TABS (ACETAMINOPHEN) as needed * VITAMIN C daily METOPROLOL SUCCINATE 25 MG XR24H-TAB (METOPROLOL SUCCINATE) Take onehalf  tablet two times a day NITROGLYCERIN 0.4 MG SUBL (NITROGLYCERIN) One tablet under tongue every 5 minutes as needed for chest pain---may repeat times three FERROUS SULFATE 325 (65 FE) MG TABS (FERROUS SULFATE) Take 1 tablet by mouth two times a day     Laboratory Results   Blood Tests     PT: 17.8 s   (Normal Range: 10.6-13.4)  INR: 2.1   (Normal Range: 0.88-1.12   Therap INR: 2.0-3.5) Comments: Rita Ohara  June 23, 2009 11:23 AM

## 2010-05-06 NOTE — Assessment & Plan Note (Signed)
Summary: pt/[name]   Nurse Visit   Allergies: 1)  Morphine Sulfate (Morphine Sulfate) Laboratory Results   Blood Tests     PT: 16.8 s   (Normal Range: 10.6-13.4)  INR: 1.9   (Normal Range: 0.88-1.12   Therap INR: 2.0-3.5)    Orders Added: 1)  Est. Patient Level I [16109] 2)  Protime [[id number]]   ANTICOAGULATION RECORD PREVIOUS REGIMEN & LAB RESULTS Anticoagulation Diagnosis:  Deep venous thrombosis pulmonary emblilsm on  10/23/2006 Previous INR Goal Range:  2.5-3.5 on  10/23/2006 Previous INR:  2.7 on  03/24/2009 Previous Coumadin Dose(mg):  5mg ,5mg ,2.5mg  altinate on  04/09/2008 Previous Regimen:  same on  03/24/2009 Previous Coagulation Comments:  Approved by Dr. Kirtland Bouchard on  11/04/2008  NEW REGIMEN & LAB RESULTS Current INR: 1.9 Regimen: same  Repeat testing in: 1 month  Anticoagulation Visit Questionnaire Coumadin dose missed/changed:  No Abnormal Bleeding Symptoms:  No  Any diet changes including alcohol intake, vegetables or greens since the last visit:  No Any illnesses or hospitalizations since the last visit:  No Any signs of clotting since the last visit (including chest discomfort, dizziness, shortness of breath, arm tingling, slurred speech, swelling or redness in leg):  No  MEDICATIONS ALLOPURINOL 300 MG TABS (ALLOPURINOL) Take 1 tablet by mouth once a day WARFARIN SODIUM 5 MG TABS (WARFARIN SODIUM) Take 1 tablet by mouth once a day exceptTues. and Thurs. 2.5 mg ZOCOR 40 MG  TABS (SIMVASTATIN) 1 once daily BABY ASPIRIN 81 MG  CHEW (ASPIRIN) 1QD VITAMIN C 1000 MG  TABS (ASCORBIC ACID) once daily HYDROCODONE-ACETAMINOPHEN 5-325 MG TABS (HYDROCODONE-ACETAMINOPHEN) 1 by mouth up to 4 times per day as needed for pain TYLENOL ARTHRITIS PAIN 650 MG  CR-TABS (ACETAMINOPHEN) as needed PLAVIX 75 MG TABS (CLOPIDOGREL BISULFATE) Take one tablet by mouth daily * VITAMIN C daily METOPROLOL SUCCINATE 25 MG XR24H-TAB (METOPROLOL SUCCINATE) Take onehalf  tablet two times a  day NITROGLYCERIN 0.4 MG SUBL (NITROGLYCERIN) One tablet under tongue every 5 minutes as needed for chest pain---may repeat times three DOXYCYCLINE HYCLATE 100 MG TABS (DOXYCYCLINE HYCLATE) one by mouth two times a day x 7 days

## 2010-05-06 NOTE — Assessment & Plan Note (Signed)
Summary: PT/RCD  Nurse Visit   Allergies: 1)  Morphine Sulfate (Morphine Sulfate) Laboratory Results   Blood Tests      INR: 1.9   (Normal Range: 0.88-1.12   Therap INR: 2.0-3.5) Comments: [name]  March 19, 2010 10:21 AM     Orders Added: 1)  Est. Patient Level I [99211] 2)  Protime [[id number]]   ANTICOAGULATION RECORD PREVIOUS REGIMEN & LAB RESULTS Anticoagulation Diagnosis:  Deep venous thrombosis pulmonary emblilsm on  10/23/2006 Previous INR Goal Range:  2.0-3.0 on  06/23/2009 Previous INR:  2.3 on  02/19/2010 Previous Coumadin Dose(mg):  2.5mg  on tue,thu 5mg  other days on  07/21/2009 Previous Regimen:  SAME on  02/19/2010 Previous Coagulation Comments:  Dr. Lovell Sheehan approved on  10/12/2009  NEW REGIMEN & LAB RESULTS Current INR: 1.9 Regimen: same  Repeat testing in: 2 weeks  Anticoagulation Visit Questionnaire Coumadin dose missed/changed:  No Abnormal Bleeding Symptoms:  No  Any diet changes including alcohol intake, vegetables or greens since the last visit:  No Any illnesses or hospitalizations since the last visit:  No Any signs of clotting since the last visit (including chest discomfort, dizziness, shortness of breath, arm tingling, slurred speech, swelling or redness in leg):  No  MEDICATIONS ALLOPURINOL 300 MG TABS (ALLOPURINOL) Take 1 tablet by mouth once a day WARFARIN SODIUM 5 MG TABS (WARFARIN SODIUM) Take 1 tablet by mouth once a day exceptTues. and Thurs. 2.5 mg ZOCOR 40 MG  TABS (SIMVASTATIN) 1 once daily BABY ASPIRIN 81 MG  CHEW (ASPIRIN) 1QD TYLENOL ARTHRITIS PAIN 650 MG  CR-TABS (ACETAMINOPHEN) as needed VITAMIN C 1000 MG TABS (ASCORBIC ACID) once daily METOPROLOL TARTRATE 25 MG TABS (METOPROLOL TARTRATE) 1/2 by mouth two times a day NITROGLYCERIN 0.4 MG SUBL (NITROGLYCERIN) One tablet under tongue every 5 minutes as needed for chest pain---may repeat times three HYDROCODONE-ACETAMINOPHEN 2.5-500 MG TABS  (HYDROCODONE-ACETAMINOPHEN) as needed

## 2010-05-06 NOTE — Assessment & Plan Note (Signed)
Summary: PT/RCD  Nurse Visit   Allergies: 1)  Morphine Sulfate (Morphine Sulfate) Laboratory Results   Blood Tests      INR: 2.3   (Normal Range: 0.88-1.12   Therap INR: 2.0-3.5) Comments: Rita Ohara  February 19, 2010 10:55 AM     Orders Added: 1)  Est. Patient Level I [99211] 2)  Protime [16109UE]   ANTICOAGULATION RECORD PREVIOUS REGIMEN & LAB RESULTS Anticoagulation Diagnosis:  Deep venous thrombosis pulmonary emblilsm on  10/23/2006 Previous INR Goal Range:  2.0-3.0 on  06/23/2009 Previous INR:  1.9 on  02/05/2010 Previous Coumadin Dose(mg):  2.5mg  on tue,thu 5mg  other days on  07/21/2009 Previous Regimen:  same on  02/05/2010 Previous Coagulation Comments:  Dr. Lovell Sheehan approved on  10/12/2009  NEW REGIMEN & LAB RESULTS Current INR: 2.3 Regimen: SAME  Repeat testing in: 4 WEEKS  Anticoagulation Visit Questionnaire Coumadin dose missed/changed:  No Abnormal Bleeding Symptoms:  No  Any diet changes including alcohol intake, vegetables or greens since the last visit:  No Any illnesses or hospitalizations since the last visit:  No Any signs of clotting since the last visit (including chest discomfort, dizziness, shortness of breath, arm tingling, slurred speech, swelling or redness in leg):  Yes  MEDICATIONS ALLOPURINOL 300 MG TABS (ALLOPURINOL) Take 1 tablet by mouth once a day WARFARIN SODIUM 5 MG TABS (WARFARIN SODIUM) Take 1 tablet by mouth once a day exceptTues. and Thurs. 2.5 mg ZOCOR 40 MG  TABS (SIMVASTATIN) 1 once daily BABY ASPIRIN 81 MG  CHEW (ASPIRIN) 1QD TYLENOL ARTHRITIS PAIN 650 MG  CR-TABS (ACETAMINOPHEN) as needed VITAMIN C 1000 MG TABS (ASCORBIC ACID) once daily METOPROLOL TARTRATE 25 MG TABS (METOPROLOL TARTRATE) 1/2 by mouth two times a day NITROGLYCERIN 0.4 MG SUBL (NITROGLYCERIN) One tablet under tongue every 5 minutes as needed for chest pain---may repeat times three HYDROCODONE-ACETAMINOPHEN 2.5-500 MG TABS  (HYDROCODONE-ACETAMINOPHEN) as needed

## 2010-05-06 NOTE — Assessment & Plan Note (Signed)
Summary: pt inr/per [facility] - Corrie Dandy and Dr. Memon/cjr   Nurse Visit   Allergies: 1)  Morphine Sulfate (Morphine Sulfate) Laboratory Results   Blood Tests      INR: 1.6   (Normal Range: 0.88-1.12   Therap INR: 2.0-3.5) Comments: Rita Ohara  October 12, 2009 2:03 PM     Orders Added: 1)  Est. Patient Level I [99211] 2)  Protime [[id number]]   ANTICOAGULATION RECORD PREVIOUS REGIMEN & LAB RESULTS Anticoagulation Diagnosis:  Deep venous thrombosis pulmonary emblilsm on  10/23/2006 Previous INR Goal Range:  2.0-3.0 on  06/23/2009 Previous INR:  2.2 on  09/18/2009 Previous Coumadin Dose(mg):  2.5mg  on tue,thu 5mg  other days on  07/21/2009 Previous Regimen:  Same dose on  08/18/2009 Previous Coagulation Comments:  Approved by Dr. Kirtland Bouchard on  11/04/2008  NEW REGIMEN & LAB RESULTS Current INR: 1.6 Regimen: 5mg . M, T, W, TH, Fri. then resume normal dose Coagulation Comments: Dr. Lovell Sheehan approved Repeat testing in: 2 weeks  Anticoagulation Visit Questionnaire Coumadin dose missed/changed:  No Abnormal Bleeding Symptoms:  No  Any diet changes including alcohol intake, vegetables or greens since the last visit:  No Any illnesses or hospitalizations since the last visit:  Yes Any signs of clotting since the last visit (including chest discomfort, dizziness, shortness of breath, arm tingling, slurred speech, swelling or redness in leg):  No  MEDICATIONS ALLOPURINOL 300 MG TABS (ALLOPURINOL) Take 1 tablet by mouth once a day WARFARIN SODIUM 5 MG TABS (WARFARIN SODIUM) Take 1 tablet by mouth once a day exceptTues. and Thurs. 2.5 mg ZOCOR 40 MG  TABS (SIMVASTATIN) 1 once daily BABY ASPIRIN 81 MG  CHEW (ASPIRIN) 1QD VITAMIN C 1000 MG  TABS (ASCORBIC ACID) once daily HYDROCODONE-ACETAMINOPHEN 5-325 MG TABS (HYDROCODONE-ACETAMINOPHEN) 1 by mouth up to 4 times per day as needed for pain TYLENOL ARTHRITIS PAIN 650 MG  CR-TABS (ACETAMINOPHEN) as needed VITAMIN C 1000 MG TABS (ASCORBIC ACID) once  daily METOPROLOL TARTRATE 25 MG TABS (METOPROLOL TARTRATE) 1/2 by mouth two times a day NITROGLYCERIN 0.4 MG SUBL (NITROGLYCERIN) One tablet under tongue every 5 minutes as needed for chest pain---may repeat times three FERROUS SULFATE 325 (65 FE) MG TABS (FERROUS SULFATE) Take 1 tablet by mouth two times a day

## 2010-05-06 NOTE — Assessment & Plan Note (Signed)
Summary: PT//CCM   Nurse Visit   Allergies: 1)  Morphine Sulfate (Morphine Sulfate) Laboratory Results   Blood Tests   Date/Time Received: July 21, 2009 3:24 PM  Date/Time Reported: July 21, 2009 3:24 PM   PT: 17.0 s   (Normal Range: 10.6-13.4)  INR: 1.9   (Normal Range: 0.88-1.12   Therap INR: 2.0-3.5) Comments: Wynona Canes, CMA  July 21, 2009 3:24 PM     Orders Added: 1)  Est. Patient Level I [99211] 2)  Protime [[id number]]  Laboratory Results   Blood Tests     PT: 17.0 s   (Normal Range: 10.6-13.4)  INR: 1.9   (Normal Range: 0.88-1.12   Therap INR: 2.0-3.5) Comments: Wynona Canes, CMA  July 21, 2009 3:24 PM       ANTICOAGULATION RECORD PREVIOUS REGIMEN & LAB RESULTS Anticoagulation Diagnosis:  Deep venous thrombosis pulmonary emblilsm on  10/23/2006 Previous INR Goal Range:  2.0-3.0 on  06/23/2009 Previous INR:  2.1 on  06/23/2009 Previous Coumadin Dose(mg):  5mg ,5mg ,2.5mg  altinate on  04/09/2008 Previous Regimen:  same on  06/23/2009 Previous Coagulation Comments:  Approved by Dr. Kirtland Bouchard on  11/04/2008  NEW REGIMEN & LAB RESULTS Current INR: 1.9 Current Coumadin Dose(mg): 2.5mg  on tue,thu 5mg  other days Regimen: 2.5mg  on tue,thu & sat 5mg  other days       Repeat testing in: 2 weeks MEDICATIONS ALLOPURINOL 300 MG TABS (ALLOPURINOL) Take 1 tablet by mouth once a day WARFARIN SODIUM 5 MG TABS (WARFARIN SODIUM) Take 1 tablet by mouth once a day exceptTues. and Thurs. 2.5 mg ZOCOR 40 MG  TABS (SIMVASTATIN) 1 once daily BABY ASPIRIN 81 MG  CHEW (ASPIRIN) 1QD VITAMIN C 1000 MG  TABS (ASCORBIC ACID) once daily HYDROCODONE-ACETAMINOPHEN 5-325 MG TABS (HYDROCODONE-ACETAMINOPHEN) 1 by mouth up to 4 times per day as needed for pain TYLENOL ARTHRITIS PAIN 650 MG  CR-TABS (ACETAMINOPHEN) as needed * VITAMIN C daily METOPROLOL SUCCINATE 25 MG XR24H-TAB (METOPROLOL SUCCINATE) Take onehalf  tablet two times a day NITROGLYCERIN 0.4 MG SUBL (NITROGLYCERIN) One  tablet under tongue every 5 minutes as needed for chest pain---may repeat times three FERROUS SULFATE 325 (65 FE) MG TABS (FERROUS SULFATE) Take 1 tablet by mouth two times a day   Anticoagulation Visit Questionnaire      Coumadin dose missed/changed:  No      Abnormal Bleeding Symptoms:  No   Any diet changes including alcohol intake, vegetables or greens since the last visit:  No Any illnesses or hospitalizations since the last visit:  No Any signs of clotting since the last visit (including chest discomfort, dizziness, shortness of breath, arm tingling, slurred speech, swelling or redness in leg):  No

## 2010-05-06 NOTE — Assessment & Plan Note (Signed)
Summary: eph/d/c 04/10/10 cone/chest pain/mj   Visit Type:  Follow-up Primary Provider:  Birdie Sons MD  CC:  no cardiac complaints.  History of Present Illness: 77 yo WM with history of CAD s/p multiple coronary interventions who has been followed in the past by Dr. Charlies Constable. In 1987 he had an anterior MI treated with TPA followed by PTCA of the LAD. In 1995 he had a stent to the RCA. In June of 2010 he  had a drug eluting stent to the circumflex artery. His last echocardiogram in March of 2009 showed ejection fraction of 45%. He was admitted in June with DVt and PE. He was hospitalized January 2012 with chest pain and ruled out for an MI with serial cardiac enzymes. His EKG was unchanged. He was discharged home without ischemic workup. He is here today for follow up. He has had no recurrence of his chest pain. His family (son in law is Sheldon Silvan of Anesthesia and daughter is radiologist) describes weakness. They are asking that we tell him not to drive anymore. They have taken his car keys away. His breathing has been ok. His family is asking for a motorized wheelchair. He lives in [place] with his wife.  His other problems include hypertension, hyperlipidemia, anemia with negative GI workup, and history of previous deep vein thrombophlebitis with PE. He has been on coumadin. He's been intolerant of Coreg.   Current Medications (verified): 1)  Allopurinol 300 Mg Tabs (Allopurinol) .... Take 1 Tablet By Mouth Once A Day 2)  Warfarin Sodium 5 Mg Tabs (Warfarin Sodium) .... Take 1 Tablet By Mouth Once A Day Excepttues. and Thurs. 2.5 Mg 3)  Zocor 40 Mg  Tabs (Simvastatin) .Marland Kitchen.. 1 Once Daily 4)  Baby Aspirin 81 Mg  Chew (Aspirin) .Marland Kitchen.. 1qd 5)  Tylenol Arthritis Pain 650 Mg  Cr-Tabs (Acetaminophen) .... As Needed 6)  Vitamin C 1000 Mg Tabs (Ascorbic Acid) .... Once Daily 7)  Metoprolol Tartrate 25 Mg Tabs (Metoprolol Tartrate) .... 1/2 By Mouth Two Times A Day 8)  Nitroglycerin 0.4 Mg Subl  (Nitroglycerin) .... One Tablet Under Tongue Every 5 Minutes As Needed For Chest Pain---May Repeat Times Three 9)  Hydrocodone-Acetaminophen 2.5-500 Mg Tabs (Hydrocodone-Acetaminophen) .... As Needed  Allergies: 1)  Morphine Sulfate (Morphine Sulfate)  Past History:  Past Medical History: Myocardial infarction, hx of Peptic ulcer disease benign kidney tumor-nephrectomy Hyperlipidemia Hypertension Pulmonary embolism, hx of-protein S deficiency chronic back pain Congestive heart failure-EF 37%, repeat echo 55% Anemia-NOS 1. Coronary artery disease status post remote anterior wall myocardial     infarction treated with streptokinase and subsequent percutaneous     transluminal coronary angioplasty with subsequent stenting of the     right coronary artery in 1994 and DES to CFX 09/2008. 2. Ischemic cardiomyopathy with ejection fraction of 34% by Myoview in     2008, but 45% by echo in March 2009. 3. History of deep vein thrombophlebitis. 4. History of transient ischemic attack. 5. Hypertension. 6. Hyperlipidemia. 7. History of anemia with negative gastrointestinal workup. 8. Intolerance to Coreg due to hypotension.   Social History: Reviewed history from 12/22/2009 and no changes required. This is a World War II veteran.  He has been married   for over 60+ years.  Lives in [place] with his wife.  Walks with a  walker.  No drugs.  No alcohol.  No tobacco.  His daughter is Dr.  Cain Saupe, who works as a Marine scientist for the [employer].  His  son-in-law as Dr. Sheldon Silvan, who is an anesthesiologist here at [facility].   Review of Systems  The patient denies fatigue, malaise, fever, weight gain/loss, vision loss, decreased hearing, hoarseness, chest pain, palpitations, shortness of breath, prolonged cough, wheezing, sleep apnea, coughing up blood, abdominal pain, blood in stool, nausea, vomiting, diarrhea, heartburn, incontinence, blood in urine, muscle weakness, joint  pain, leg swelling, rash, skin lesions, headache, fainting, dizziness, depression, anxiety, enlarged lymph nodes, easy bruising or bleeding, and environmental allergies.    Vital Signs:  Patient profile:   77 year old male Height:      66 inches Weight:      174.25 pounds Pulse rate:   68 / minute BP sitting:   124 / 70  (left arm) Cuff size:   regular  Vitals Entered By: Haze Boyden, CMA (April 21, 2010 9:26 AM)  Physical Exam  General:  General: Well developed, well nourished, NAD HEENT: OP clear, mucus membranes moist SKIN: warm, dry Neuro: No focal deficits Musculoskeletal: Muscle strength 5/5 all ext Psychiatric: Mood and affect normal Neck: No JVD, no carotid bruits, no thyromegaly, no lymphadenopathy. Lungs:Clear bilaterally, no wheezes, rhonci, crackles CV: RRR no murmurs, gallops rubs Abdomen: soft, NT, ND, BS present Extremities: No edema, pulses 2+.    Impression & Recommendations:  Problem # 1:  CORONARY ARTERY DISEASE (ICD-414.00) Stable. No recurrence of chest pain. Continue medical management.  He will let us know if his clinical status changes.  His weakness is likely multifactorial including his age and CAD. I have asked him not to drive based on this. He is also asking for a motorized wheelchair. I will forward this request to Dr. Cato Mulligan. He is asking that we check his PT/INR today. We will forward results to Dr. Cato Mulligan office.   His updated medication list for this problem includes:    Warfarin Sodium 5 Mg Tabs (Warfarin sodium) .Marland Kitchen... Take 1 tablet by mouth once a day excepttues. and thurs. 2.5 mg    Baby Aspirin 81 Mg Chew (Aspirin) .Marland Kitchen... 1qd    Metoprolol Tartrate 25 Mg Tabs (Metoprolol tartrate) .Marland Kitchen... 1/2 by mouth two times a day    Nitroglycerin 0.4 Mg Subl (Nitroglycerin) ..... One tablet under tongue every 5 minutes as needed for chest pain---may repeat times three  Problem # 2:  HYPERTENSION (ICD-401.9) BP well controlled.  He will not start  taking his Lisinopril.   His updated medication list for this problem includes:    Baby Aspirin 81 Mg Chew (Aspirin) .Marland Kitchen... 1qd    Metoprolol Tartrate 25 Mg Tabs (Metoprolol tartrate) .Marland Kitchen... 1/2 by mouth two times a day  Other Orders: TLB-PT (Protime) (85610-PTP)  Patient Instructions: 1)  Your physician recommends that you schedule a follow-up appointment in: 6 months 2)  Your physician has recommended you make the following change in your medication: STOP LISINOPRIL.

## 2010-05-06 NOTE — Assessment & Plan Note (Signed)
Summary: [name]   Nurse Visit   Allergies: 1)  Morphine Sulfate (Morphine Sulfate) Laboratory Results   Blood Tests     PT: 14.4 s   (Normal Range: 10.6-13.4)  INR: 1.4   (Normal Range: 0.88-1.12   Therap INR: 2.0-3.5) Comments: Rita Ohara  Aug 04, 2009 11:20 AM     Orders Added: 1)  Est. Patient Level I [99211] 2)  Protime [[id number]]   ANTICOAGULATION RECORD PREVIOUS REGIMEN & LAB RESULTS Anticoagulation Diagnosis:  Deep venous thrombosis pulmonary emblilsm on  10/23/2006 Previous INR Goal Range:  2.0-3.0 on  06/23/2009 Previous INR:  1.9 on  07/21/2009 Previous Coumadin Dose(mg):  2.5mg  on tue,thu 5mg  other days on  07/21/2009 Previous Regimen:  2.5mg  on tue,thu & sat 5mg  other days on  07/21/2009 Previous Coagulation Comments:  Approved by Dr. Kirtland Bouchard on  11/04/2008  NEW REGIMEN & LAB RESULTS Current INR: 1.4 Regimen: 5mg . Tue. and Sat all others 5mg .  Repeat testing in: 2 weeks  Anticoagulation Visit Questionnaire Coumadin dose missed/changed:  No Abnormal Bleeding Symptoms:  No  Any diet changes including alcohol intake, vegetables or greens since the last visit:  No Any illnesses or hospitalizations since the last visit:  No Any signs of clotting since the last visit (including chest discomfort, dizziness, shortness of breath, arm tingling, slurred speech, swelling or redness in leg):  No  MEDICATIONS ALLOPURINOL 300 MG TABS (ALLOPURINOL) Take 1 tablet by mouth once a day WARFARIN SODIUM 5 MG TABS (WARFARIN SODIUM) Take 1 tablet by mouth once a day exceptTues. and Thurs. 2.5 mg ZOCOR 40 MG  TABS (SIMVASTATIN) 1 once daily BABY ASPIRIN 81 MG  CHEW (ASPIRIN) 1QD VITAMIN C 1000 MG  TABS (ASCORBIC ACID) once daily HYDROCODONE-ACETAMINOPHEN 5-325 MG TABS (HYDROCODONE-ACETAMINOPHEN) 1 by mouth up to 4 times per day as needed for pain TYLENOL ARTHRITIS PAIN 650 MG  CR-TABS (ACETAMINOPHEN) as needed * VITAMIN C daily METOPROLOL SUCCINATE 25 MG XR24H-TAB  (METOPROLOL SUCCINATE) Take onehalf  tablet two times a day NITROGLYCERIN 0.4 MG SUBL (NITROGLYCERIN) One tablet under tongue every 5 minutes as needed for chest pain---may repeat times three FERROUS SULFATE 325 (65 FE) MG TABS (FERROUS SULFATE) Take 1 tablet by mouth two times a day

## 2010-05-06 NOTE — Assessment & Plan Note (Signed)
Summary: pt [name]   Nurse Visit   Allergies: 1)  Morphine Sulfate (Morphine Sulfate) Laboratory Results   Blood Tests   Date/Time Received: May 19, 2009 10:38 AM   Date/Time Reported: May 19, 2009 10:37 AM   PT: 17.9 s   (Normal Range: 10.6-13.4)  INR: 2.1   (Normal Range: 0.88-1.12   Therap INR: 2.0-3.5) Comments: By Laney Potash    Orders Added: 1)  Est. Patient Level I [40981] 2)  Protime [[id number]]    ANTICOAGULATION RECORD PREVIOUS REGIMEN & LAB RESULTS Anticoagulation Diagnosis:  Deep venous thrombosis pulmonary emblilsm on  10/23/2006 Previous INR Goal Range:  2.5-3.5 on  10/23/2006 Previous INR:  1.9 on  04/21/2009 Previous Coumadin Dose(mg):  5mg ,5mg ,2.5mg  altinate on  04/09/2008 Previous Regimen:  same on  04/21/2009 Previous Coagulation Comments:  Approved by Dr. Kirtland Bouchard on  11/04/2008  NEW REGIMEN & LAB RESULTS Current INR: 2.1 Regimen: same dose  Provider: Dr. Cato Mulligan Repeat testing in: 4 weeks  Anticoagulation Visit Questionnaire Coumadin dose missed/changed:  No Abnormal Bleeding Symptoms:  No  Any diet changes including alcohol intake, vegetables or greens since the last visit:  No Any illnesses or hospitalizations since the last visit:  No Any signs of clotting since the last visit (including chest discomfort, dizziness, shortness of breath, arm tingling, slurred speech, swelling or redness in leg):  No  MEDICATIONS ALLOPURINOL 300 MG TABS (ALLOPURINOL) Take 1 tablet by mouth once a day WARFARIN SODIUM 5 MG TABS (WARFARIN SODIUM) Take 1 tablet by mouth once a day exceptTues. and Thurs. 2.5 mg ZOCOR 40 MG  TABS (SIMVASTATIN) 1 once daily BABY ASPIRIN 81 MG  CHEW (ASPIRIN) 1QD VITAMIN C 1000 MG  TABS (ASCORBIC ACID) once daily HYDROCODONE-ACETAMINOPHEN 5-325 MG TABS (HYDROCODONE-ACETAMINOPHEN) 1 by mouth up to 4 times per day as needed for pain TYLENOL ARTHRITIS PAIN 650 MG  CR-TABS (ACETAMINOPHEN) as needed PLAVIX 75 MG TABS (CLOPIDOGREL  BISULFATE) Take one tablet by mouth daily * VITAMIN C daily METOPROLOL SUCCINATE 25 MG XR24H-TAB (METOPROLOL SUCCINATE) Take onehalf  tablet two times a day NITROGLYCERIN 0.4 MG SUBL (NITROGLYCERIN) One tablet under tongue every 5 minutes as needed for chest pain---may repeat times three DOXYCYCLINE HYCLATE 100 MG TABS (DOXYCYCLINE HYCLATE) one by mouth two times a day x 7 days

## 2010-05-06 NOTE — Miscellaneous (Signed)
Summary: Power of [place], Health Care Power of Attorney  Power of [place], Health Care Power of Attorney   Imported By: Maryln Gottron 10/01/2009 09:23:08  _____________________________________________________________________  External Attachment:    Type:   Image     Comment:   External Document

## 2010-05-06 NOTE — Assessment & Plan Note (Signed)
Summary: PT//SLM  Nurse Visit   Allergies: 1)  Morphine Sulfate (Morphine Sulfate) Laboratory Results   Blood Tests      INR: 1.8   (Normal Range: 0.88-1.12   Therap INR: 2.0-3.5) Comments: Rita Ohara  April 13, 2010 11:20 AM     Orders Added: 1)  Est. Patient Level I [99211] 2)  Protime [[id number]]   ANTICOAGULATION RECORD PREVIOUS REGIMEN & LAB RESULTS Anticoagulation Diagnosis:  Deep venous thrombosis pulmonary emblilsm on  10/23/2006 Previous INR Goal Range:  2.0-3.0 on  06/23/2009 Previous INR:  2.3 on  04/01/2010 Previous Coumadin Dose(mg):  2.5mg  on tue,thu 5mg  other days on  07/21/2009 Previous Regimen:  same on  04/01/2010 Previous Coagulation Comments:  Dr. Lovell Sheehan approved on  10/12/2009  NEW REGIMEN & LAB RESULTS Current INR: 1.8 Regimen: same  Repeat testing in: 1 week  Anticoagulation Visit Questionnaire Coumadin dose missed/changed:  No Abnormal Bleeding Symptoms:  No  Any diet changes including alcohol intake, vegetables or greens since the last visit:  No Any illnesses or hospitalizations since the last visit:  Yes Any signs of clotting since the last visit (including chest discomfort, dizziness, shortness of breath, arm tingling, slurred speech, swelling or redness in leg):  No  MEDICATIONS ALLOPURINOL 300 MG TABS (ALLOPURINOL) Take 1 tablet by mouth once a day WARFARIN SODIUM 5 MG TABS (WARFARIN SODIUM) Take 1 tablet by mouth once a day exceptTues. and Thurs. 2.5 mg ZOCOR 40 MG  TABS (SIMVASTATIN) 1 once daily BABY ASPIRIN 81 MG  CHEW (ASPIRIN) 1QD TYLENOL ARTHRITIS PAIN 650 MG  CR-TABS (ACETAMINOPHEN) as needed VITAMIN C 1000 MG TABS (ASCORBIC ACID) once daily METOPROLOL TARTRATE 25 MG TABS (METOPROLOL TARTRATE) 1/2 by mouth two times a day NITROGLYCERIN 0.4 MG SUBL (NITROGLYCERIN) One tablet under tongue every 5 minutes as needed for chest pain---may repeat times three HYDROCODONE-ACETAMINOPHEN 2.5-500 MG TABS  (HYDROCODONE-ACETAMINOPHEN) as needed

## 2010-05-06 NOTE — Assessment & Plan Note (Signed)
Summary: f30m  Medications Added HYDROCODONE-ACETAMINOPHEN 2.5-500 MG TABS (HYDROCODONE-ACETAMINOPHEN) as needed        Visit Type:  6 months follow up Primary Ouida Abeyta:  Birdie Sons MD  CC:  No complains.  History of Present Illness: Mr. Yo returns for management of CAD. He is 77 years. In 1987 he had an anterior MI treated with TPA followed by PTCA of the LAD. In 1995 he had a stent to the RCA. In June of 2000 and had a drug alluding stent to the circumflex artery. His last echocardiogram in March of 2009 showed ejection fraction of 45%.  S. no recent chest pain shortness of breath or palpitations.  He lives in [place] with his wife.  He was hospitalized in July with effect of deep vein thrombophlebitis.  His other problems include hypertension, hyperlipidemia, anemia with negative GI workup, and history of previous deep vein thrombophlebitis. He's been intolerant of Coreg.  Current Medications (verified): 1)  Allopurinol 300 Mg Tabs (Allopurinol) .... Take 1 Tablet By Mouth Once A Day 2)  Warfarin Sodium 5 Mg Tabs (Warfarin Sodium) .... Take 1 Tablet By Mouth Once A Day Excepttues. and Thurs. 2.5 Mg 3)  Zocor 40 Mg  Tabs (Simvastatin) .Marland Kitchen.. 1 Once Daily 4)  Baby Aspirin 81 Mg  Chew (Aspirin) .Marland Kitchen.. 1qd 5)  Tylenol Arthritis Pain 650 Mg  Cr-Tabs (Acetaminophen) .... As Needed 6)  Vitamin C 1000 Mg Tabs (Ascorbic Acid) .... Once Daily 7)  Metoprolol Tartrate 25 Mg Tabs (Metoprolol Tartrate) .... 1/2 By Mouth Two Times A Day 8)  Nitroglycerin 0.4 Mg Subl (Nitroglycerin) .... One Tablet Under Tongue Every 5 Minutes As Needed For Chest Pain---May Repeat Times Three 9)  Hydrocodone-Acetaminophen 2.5-500 Mg Tabs (Hydrocodone-Acetaminophen) .... As Needed  Allergies: 1)  Morphine Sulfate (Morphine Sulfate)  Past History:  Past Medical History: Reviewed history from 10/05/2008 and no changes required. Coronary artery disease Myocardial infarction, hx of Peptic ulcer  disease benign kidney tumor-nephrectomy Hyperlipidemia Hypertension Pulmonary embolism, hx of-protein S deficiency chronic back pain Congestive heart failure-EF 37%, repeat echo 55% Anemia-NOS 1. Coronary artery disease status post remote anterior wall myocardial     infarction treated with streptokinase and subsequent percutaneous     transluminal coronary angioplasty with subsequent stenting of the     right coronary artery in 1994 and DES to CFX 09/2008. 2. Ischemic cardiomyopathy with ejection fraction of 34% by Myoview in     2008, but 45% by echo in March 2009. 3. History of deep vein thrombophlebitis. 4. History of transient ischemic attack. 5. Hypertension. 6. Hyperlipidemia. 7. History of anemia with negative gastrointestinal workup. 8. Intolerance to Coreg due to hypotension.   Review of Systems       ROS is negative except as outlined in HPI.   Vital Signs:  Patient profile:   77 year old male Height:      66 inches Weight:      169.75 pounds BMI:     27.50 Pulse rate:   66 / minute Pulse rhythm:   regular Resp:     18 per minute BP sitting:   118 / 82  (left arm) Cuff size:   large  Vitals Entered By: Vikki Ports (December 23, 2009 10:11 AM)  Physical Exam  Additional Exam:  Gen. Well-nourished, in no distress   Neck: No JVD, thyroid not enlarged, no carotid bruits Lungs: No tachypnea, clear without rales, rhonchi or wheezes Cardiovascular: Rhythm regular, PMI not displaced,  heart sounds  normal,  no murmurs or gallops, no peripheral edema, pulses normal in all 4 extremities. Abdomen: BS normal, abdomen soft and non-tender without masses or organomegaly, no hepatosplenomegaly. MS: No deformities, no cyanosis or clubbing   Neuro:  No focal sns   Skin:  no lesions    Impression & Recommendations:  Problem # 1:  CORONARY ARTERY DISEASE (ICD-414.00) He has had multiple prior PCI procedures as described in history of present illness. He's had no recent  chest pain this appears stable.  His updated medication list for this problem includes:    Warfarin Sodium 5 Mg Tabs (Warfarin sodium) .Marland Kitchen... Take 1 tablet by mouth once a day excepttues. and thurs. 2.5 mg    Baby Aspirin 81 Mg Chew (Aspirin) .Marland Kitchen... 1qd    Metoprolol Tartrate 25 Mg Tabs (Metoprolol tartrate) .Marland Kitchen... 1/2 by mouth two times a day    Nitroglycerin 0.4 Mg Subl (Nitroglycerin) ..... 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 We've stopped his Plavix 6 months after his stent because of the combination of Coumadin and Plavix.  Problem # 3:  HYPERTENSION (ICD-401.9) This is well-controlled on current therapy. His updated medication list for this problem includes:    Baby Aspirin 81 Mg Chew (Aspirin) .Marland Kitchen... 1qd    Metoprolol Tartrate 25 Mg Tabs (Metoprolol tartrate) .Marland Kitchen... 1/2 by mouth two times a day  Orders: EKG w/ Interpretation (93000)  Patient Instructions: 1)  Your physician recommends that you continue on your current medications as directed. Please refer to the Current Medication list given to you today. 2)  Your physician wants you to follow-up in: 1 year with Dr. Clifton James.  You will receive a reminder letter in the mail two months in advance. If you don't receive a letter, please call our office to schedule the follow-up appointment.

## 2010-05-06 NOTE — Consult Note (Signed)
Summary: [facility]   Imported By: Roderic Ovens 10/19/2009 10:06:16  _____________________________________________________________________  External Attachment:    Type:   Image     Comment:   External Document

## 2010-05-06 NOTE — Assessment & Plan Note (Signed)
Summary: ROA/FUP/RCD   Vital Signs:  Patient profile:   77 year old male Weight:      169 pounds Temp:     98.4 degrees F oral Pulse rate:   64 / minute Pulse rhythm:   regular BP sitting:   132 / 84  (left arm) Cuff size:   regular  Vitals Entered By: Alfred Levins, CMA (March 01, 2010 9:01 AM) CC: f/u, not fasting   Primary Care Zykeem Bauserman:  Birdie Sons MD  CC:  f/u and not fasting.  History of Present Illness:  Follow-Up Visit      This is an 77 year old man who presents for Follow-up visit.  The patient denies chest pain and palpitations.  Since the last visit the patient notes no new problems or concerns.  When questioned about possible medication side effects, the patient notes none.  He and his wife are doing reasonaby well at home. No falls. He is using walker almost all of the time.   All other systems reviewed and were negative   Current Problems (verified): 1)  Encounter For Therapeutic Drug Monitoring  (ICD-V58.83) 2)  Deep Venous Thrombophlebitis, Chronic  (ICD-453.40) 3)  Renal Insufficiency  (ICD-588.9) 4)  Back Pain  (ICD-724.5) 5)  Anemia-nos  (ICD-285.9) 6)  Aftercare, Long-term Use, Anticoagulants  (ICD-V58.61) 7)  Congestive Heart Failure  (ICD-428.0) 8)  Pulmonary Embolism, Hx of  (ICD-V12.51) 9)  Hypertension  (ICD-401.9) 10)  Hyperlipidemia  (ICD-272.4) 11)  Peptic Ulcer Disease  (ICD-533.90) 12)  Myocardial Infarction, Hx of  (ICD-412) 13)  Coronary Artery Disease  (ICD-414.00)  Current Medications (verified): 1)  Allopurinol 300 Mg Tabs (Allopurinol) .... Take 1 Tablet By Mouth Once A Day 2)  Warfarin Sodium 5 Mg Tabs (Warfarin Sodium) .... Take 1 Tablet By Mouth Once A Day Excepttues. and Thurs. 2.5 Mg 3)  Zocor 40 Mg  Tabs (Simvastatin) .Marland Kitchen.. 1 Once Daily 4)  Baby Aspirin 81 Mg  Chew (Aspirin) .Marland Kitchen.. 1qd 5)  Tylenol Arthritis Pain 650 Mg  Cr-Tabs (Acetaminophen) .... As Needed 6)  Vitamin C 1000 Mg Tabs (Ascorbic Acid) .... Once Daily 7)   Metoprolol Tartrate 25 Mg Tabs (Metoprolol Tartrate) .... 1/2 By Mouth Two Times A Day 8)  Nitroglycerin 0.4 Mg Subl (Nitroglycerin) .... One Tablet Under Tongue Every 5 Minutes As Needed For Chest Pain---May Repeat Times Three 9)  Hydrocodone-Acetaminophen 2.5-500 Mg Tabs (Hydrocodone-Acetaminophen) .... As Needed  Allergies (verified): 1)  Morphine Sulfate (Morphine Sulfate)  Past History:  Past Medical History: Last updated: 10/05/2008 Coronary artery disease Myocardial infarction, hx of Peptic ulcer disease benign kidney tumor-nephrectomy Hyperlipidemia Hypertension Pulmonary embolism, hx of-protein S deficiency chronic back pain Congestive heart failure-EF 37%, repeat echo 55% Anemia-NOS 1. Coronary artery disease status post remote anterior wall myocardial     infarction treated with streptokinase and subsequent percutaneous     transluminal coronary angioplasty with subsequent stenting of the     right coronary artery in 1994 and DES to CFX 09/2008. 2. Ischemic cardiomyopathy with ejection fraction of 34% by Myoview in     2008, but 45% by echo in March 2009. 3. History of deep vein thrombophlebitis. 4. History of transient ischemic attack. 5. Hypertension. 6. Hyperlipidemia. 7. History of anemia with negative gastrointestinal workup. 8. Intolerance to Coreg due to hypotension.   Past Surgical History: Last updated: 12/22/2009 Appendectomy Cataract extraction Gastrectomy Nephrectomy Percutaneous transluminal coronary angioplasty  Family History: Last updated: 12/22/2009  Significant for diabetes and stroke in his mother.  His  father died at age 40-1/2 of old age.  Per e-Chart his sister had  leukemia, and per the patient his son has cirrhosis and is status post  liver transplant.      Social History: Last updated: 12/22/2009 This is a World War II veteran.  He has been married   for over 60+ years.  Lives in [place] with his wife.  Walks with a  walker.   No drugs.  No alcohol.  No tobacco.  His daughter is Dr.  Cain Saupe, who works as a Marine scientist for the [employer].  His  son-in-law as Dr. Sheldon Silvan, who is an anesthesiologist here at [facility].   Risk Factors: Exercise: no (01/12/2007)  Risk Factors: Smoking Status: never (09/03/2009)  Physical Exam  General:  well-developed well-nourished male, elderly. No acute distress her HEENT exam atraumatic, normocephalic, neck supple. Chest clear to auscultation cardiac exam S1-S2 are regular. Abdominal exam active bowel sounds, soft her extremities no edema. Some superficial varicose veins. Neurologic exam is alert he is oriented. He walks slowly and with a walker and a broad-based gait.   Impression & Recommendations:  Problem # 1:  DEEP VENOUS THROMBOPHLEBITIS, CHRONIC (ICD-453.40) monthly protimes Will need life long warfarin (recurrent thrombophlebitis)  Problem # 2:  HYPERTENSION (ICD-401.9) controlled continue current medications  His updated medication list for this problem includes:    Metoprolol Tartrate 25 Mg Tabs (Metoprolol tartrate) .Marland Kitchen... 1/2 by mouth two times a day  BP today: 132/84 Prior BP: 118/82 (12/23/2009)  Labs Reviewed: K+: 4.2 (09/03/2009) Creat: : 1.7 (09/03/2009)   Chol: 141 (06/23/2009)   HDL: 47.90 (06/23/2009)   LDL: 71 (06/23/2009)   TG: 110.0 (06/23/2009)  Problem # 3:  HYPERLIPIDEMIA (ICD-272.4)  needs labs check today His updated medication list for this problem includes:    Zocor 40 Mg Tabs (Simvastatin) .Marland Kitchen... 1 once daily  Labs Reviewed: SGOT: 23 (09/03/2009)   SGPT: 21 (09/03/2009)   HDL:47.90 (06/23/2009), 42.6 (04/09/2008)  LDL:71 (06/23/2009), 76 (04/09/2008)  Chol:141 (06/23/2009), 144 (04/09/2008)  Trig:110.0 (06/23/2009), 128 (04/09/2008)  Orders: TLB-Hepatic/Liver Function Pnl (80076-HEPATIC) TLB-Lipid Panel (80061-LIPID) TLB-TSH (Thyroid Stimulating Hormone) (84443-TSH)  Problem # 4:  ANEMIA-NOS  (ICD-285.9)  resolved Hgb: 14.8 (09/03/2009)   Hct: 43.5 (09/03/2009)   Platelets: 117.0 (09/03/2009) RBC: 4.19 (09/03/2009)   RDW: 17.3 (09/03/2009)   WBC: 6.9 (09/03/2009) MCV: 103.6 (09/03/2009)   MCHC: 34.1 (09/03/2009) Ferritin: 15.8 (06/23/2009) Iron: 52 (06/23/2009)   % Sat: 14.4 (06/23/2009) B12: 551 (06/23/2009)   Folate: 13.4 (04/26/2006)   TSH: 1.35 (06/23/2009)  Orders: TLB-CBC Platelet - w/Differential (85025-CBCD)  Complete Medication List: 1)  Allopurinol 300 Mg Tabs (Allopurinol) .... Take 1 tablet by mouth once a day 2)  Warfarin Sodium 5 Mg Tabs (Warfarin sodium) .... Take 1 tablet by mouth once a day excepttues. and thurs. 2.5 mg 3)  Zocor 40 Mg Tabs (Simvastatin) .Marland Kitchen.. 1 once daily 4)  Baby Aspirin 81 Mg Chew (Aspirin) .Marland Kitchen.. 1qd 5)  Tylenol Arthritis Pain 650 Mg Cr-tabs (Acetaminophen) .... As needed 6)  Vitamin C 1000 Mg Tabs (Ascorbic acid) .... Once daily 7)  Metoprolol Tartrate 25 Mg Tabs (Metoprolol tartrate) .... 1/2 by mouth two times a day 8)  Nitroglycerin 0.4 Mg Subl (Nitroglycerin) .... One tablet under tongue every 5 minutes as needed for chest pain---may repeat times three 9)  Hydrocodone-acetaminophen 2.5-500 Mg Tabs (Hydrocodone-acetaminophen) .... As needed  Other Orders: Venipuncture (60454) TLB-BMP (Basic Metabolic Panel-BMET) (80048-METABOL)  Patient Instructions: 1)  Please schedule a follow-up appointment in 4 months.   Orders Added: 1)  Est. Patient Level IV [82956] 2)  Venipuncture [36415] 3)  TLB-BMP (Basic Metabolic Panel-BMET) [80048-METABOL] 4)  TLB-CBC Platelet - w/Differential [85025-CBCD] 5)  TLB-Hepatic/Liver Function Pnl [80076-HEPATIC] 6)  TLB-Lipid Panel [80061-LIPID] 7)  TLB-TSH (Thyroid Stimulating Hormone) [21308-MVH]  Appended Document: Orders Update    Clinical Lists Changes  Orders: Added new Service order of Specimen Handling (84696) - Signed

## 2010-05-12 NOTE — Assessment & Plan Note (Signed)
Summary: TO DISCUSS MOTORIZED SCOOTER/THEN GO TO PT/NJR   Vital Signs:  Patient profile:   77 year old male Weight:      171 pounds Temp:     98.3 degrees F oral Pulse rate:   60 / minute Pulse rhythm:   regular BP sitting:   122 / 74  (left arm) Cuff size:   regular  Vitals Entered By: Alfred Levins, CMA (May 04, 2010 10:10 AM) CC: rx for electric chair, hosp f/u   Primary Care Provider:  Birdie Sons MD  CC:  rx for electric chair and hosp f/u.  History of Present Illness: pt upset that family is asking him to not drive- pt states he has never had an accident that was his fault  Face to face evaluation for wheel chair evaluation. Pt cannot walk without assistance and has had several falls with walker pain from OA is cause of mobility limitation.  he is unable to manipulate manual wheelchair.    review of systems:  patient has chronic aches and pains of both knees and hips. Has chronic back pain. Difficult to ambulate. Patient denies chest pain. He does have chronic dyspnea on exertion.  no other specific complaints and a complete review of systems.  Current Medications (verified): 1)  Allopurinol 300 Mg Tabs (Allopurinol) .... Take 1 Tablet By Mouth Once A Day 2)  Warfarin Sodium 5 Mg Tabs (Warfarin Sodium) .... Take 1 Tablet By Mouth Once A Day Excepttues. and Thurs. 2.5 Mg 3)  Zocor 40 Mg  Tabs (Simvastatin) .Marland Kitchen.. 1 Once Daily 4)  Baby Aspirin 81 Mg  Chew (Aspirin) .Marland Kitchen.. 1qd 5)  Tylenol Arthritis Pain 650 Mg  Cr-Tabs (Acetaminophen) .... As Needed 6)  Vitamin C 1000 Mg Tabs (Ascorbic Acid) .... Once Daily 7)  Metoprolol Tartrate 25 Mg Tabs (Metoprolol Tartrate) .... 1/2 By Mouth Two Times A Day 8)  Nitroglycerin 0.4 Mg Subl (Nitroglycerin) .... One Tablet Under Tongue Every 5 Minutes As Needed For Chest Pain---May Repeat Times Three 9)  Hydrocodone-Acetaminophen 2.5-500 Mg Tabs (Hydrocodone-Acetaminophen) .... As Needed  Allergies (verified): 1)  Morphine Sulfate  (Morphine Sulfate)  Past History:  Past Medical History: Last updated: 04/21/2010 Myocardial infarction, hx of Peptic ulcer disease benign kidney tumor-nephrectomy Hyperlipidemia Hypertension Pulmonary embolism, hx of-protein S deficiency chronic back pain Congestive heart failure-EF 37%, repeat echo 55% Anemia-NOS 1. Coronary artery disease status post remote anterior wall myocardial     infarction treated with streptokinase and subsequent percutaneous     transluminal coronary angioplasty with subsequent stenting of the     right coronary artery in 1994 and DES to CFX 09/2008. 2. Ischemic cardiomyopathy with ejection fraction of 34% by Myoview in     2008, but 45% by echo in March 2009. 3. History of deep vein thrombophlebitis. 4. History of transient ischemic attack. 5. Hypertension. 6. Hyperlipidemia. 7. History of anemia with negative gastrointestinal workup. 8. Intolerance to Coreg due to hypotension.   Past Surgical History: Last updated: 12/22/2009 Appendectomy Cataract extraction Gastrectomy Nephrectomy Percutaneous transluminal coronary angioplasty  Family History: Last updated: 12/22/2009  Significant for diabetes and stroke in his mother.  His  father died at age 64-1/2 of old age.  Per e-Chart his sister had  leukemia, and per the patient his son has cirrhosis and is status post  liver transplant.      Social History: Last updated: 12/22/2009 This is a World War II veteran.  He has been married   for over 60+ years.  Lives in [place] with his wife.  Walks with a  walker.  No drugs.  No alcohol.  No tobacco.  His daughter is Dr.  Cain Saupe, who works as a Marine scientist for the [employer].  His  son-in-law as Dr. Sheldon Silvan, who is an anesthesiologist here at [facility].   Risk Factors: Exercise: no (01/12/2007)  Risk Factors: Smoking Status: never (09/03/2009)  Physical Exam  General:   elderly male. Frail-appearing. HEENT exam  atraumatic, normocephalic neck is supple without lymphadenopathy or jugular venous distention. Chest clear to auscultation cardiac exam S1-S2 are regular. Abdominal exam activounds, soft. Extremies  is 1-2+ edema to the midcalf bilaterally.  Patient is walking with a walker. He is very unstable.   Impression & Recommendations:  Problem # 1:  ABNORMALITY OF GAIT (ICD-781.2)  patient has multiple falls. he will need a power wheelchair. He will be unable from a motor point of view to manipulate a manual wheelchair. He will need electric wheelchair. He is a great fall risk and has already fallen several times. this was discussed with the patient and his son.   In addition patient is still driving. I advised that he no longer drive.  Problem # 2:  PULMONARY EMBOLISM, HX OF (ICD-V12.51)  recurrent pulmonary emboli. Will need lifelong warfarin His updated medication list for this problem includes:    Warfarin Sodium 5 Mg Tabs (Warfarin sodium) .Marland Kitchen... Take 1 tablet by mouth once a day excepttues. and thurs. 2.5 mg    Baby Aspirin 81 Mg Chew (Aspirin) .Marland Kitchen... 1qd  Orders: Protime (16109UE) Fingerstick (45409)  Complete Medication List: 1)  Allopurinol 300 Mg Tabs (Allopurinol) .... Take 1 tablet by mouth once a day 2)  Warfarin Sodium 5 Mg Tabs (Warfarin sodium) .... Take 1 tablet by mouth once a day excepttues. and thurs. 2.5 mg 3)  Zocor 40 Mg Tabs (Simvastatin) .Marland Kitchen.. 1 once daily 4)  Baby Aspirin 81 Mg Chew (Aspirin) .Marland Kitchen.. 1qd 5)  Tylenol Arthritis Pain 650 Mg Cr-tabs (Acetaminophen) .... As needed 6)  Vitamin C 1000 Mg Tabs (Ascorbic acid) .... Once daily 7)  Metoprolol Tartrate 25 Mg Tabs (Metoprolol tartrate) .... 1/2 by mouth two times a day 8)  Nitroglycerin 0.4 Mg Subl (Nitroglycerin) .... One tablet under tongue every 5 minutes as needed for chest pain---may repeat times three 9)  Hydrocodone-acetaminophen 2.5-500 Mg Tabs (Hydrocodone-acetaminophen) .... As needed   Orders Added: 1)   Protime [85610QW] 2)  Fingerstick [36416] 3)  Est. Patient Level IV [81191]     ANTICOAGULATION RECORD PREVIOUS REGIMEN & LAB RESULTS Anticoagulation Diagnosis:  Deep venous thrombosis pulmonary emblilsm on  10/23/2006 Previous INR Goal Range:  2.0-3.0 on  06/23/2009 Previous INR:  2.5 ratio on  04/21/2010 Previous Coumadin Dose(mg):  2.5mg  on tue,thu 5mg  other days on  07/21/2009 Previous Regimen:  same on  04/13/2010 Previous Coagulation Comments:  Dr. Lovell Sheehan approved on  10/12/2009  NEW REGIMEN & LAB RESULTS Current INR: 2.4 Regimen: same  Repeat testing in: 4 weeks  Anticoagulation Visit Questionnaire Coumadin dose missed/changed:  No Abnormal Bleeding Symptoms:  No  Any diet changes including alcohol intake, vegetables or greens since the last visit:  No Any illnesses or hospitalizations since the last visit:  Yes      Recent Illness/Hospitalizations:  Pain in chest. Any signs of clotting since the last visit (including chest discomfort, dizziness, shortness of breath, arm tingling, slurred speech, swelling or redness in leg):  No  MEDICATIONS ALLOPURINOL 300  MG TABS (ALLOPURINOL) Take 1 tablet by mouth once a day WARFARIN SODIUM 5 MG TABS (WARFARIN SODIUM) Take 1 tablet by mouth once a day exceptTues. and Thurs. 2.5 mg ZOCOR 40 MG  TABS (SIMVASTATIN) 1 once daily BABY ASPIRIN 81 MG  CHEW (ASPIRIN) 1QD TYLENOL ARTHRITIS PAIN 650 MG  CR-TABS (ACETAMINOPHEN) as needed VITAMIN C 1000 MG TABS (ASCORBIC ACID) once daily METOPROLOL TARTRATE 25 MG TABS (METOPROLOL TARTRATE) 1/2 by mouth two times a day NITROGLYCERIN 0.4 MG SUBL (NITROGLYCERIN) One tablet under tongue every 5 minutes as needed for chest pain---may repeat times three HYDROCODONE-ACETAMINOPHEN 2.5-500 MG TABS (HYDROCODONE-ACETAMINOPHEN) as needed    Laboratory Results   Blood Tests      INR: 2.4   (Normal Range: 0.88-1.12   Therap INR: 2.0-3.5) Comments: Rita Ohara  May 04, 2010 10:55  AM

## 2010-05-25 ENCOUNTER — Ambulatory Visit (INDEPENDENT_AMBULATORY_CARE_PROVIDER_SITE_OTHER): Payer: Medicare Other | Admitting: Internal Medicine

## 2010-05-25 DIAGNOSIS — I2699 Other pulmonary embolism without acute cor pulmonale: Secondary | ICD-10-CM

## 2010-05-25 DIAGNOSIS — I82409 Acute embolism and thrombosis of unspecified deep veins of unspecified lower extremity: Secondary | ICD-10-CM

## 2010-06-01 ENCOUNTER — Ambulatory Visit: Payer: Self-pay

## 2010-06-07 ENCOUNTER — Telehealth: Payer: Self-pay | Admitting: Internal Medicine

## 2010-06-07 NOTE — Telephone Encounter (Signed)
[facility] [employer] called and said that pt was in for mobility eval. Faxed over medicare forms for Dr Cato Mulligan to complete. Checking status of form completion. Can offer walk through assistance to aid in completing form if necessary. Pls call back asap or fax completed and signed form, back to                    Fax [phone]

## 2010-06-08 NOTE — Telephone Encounter (Signed)
This is in your basket.  I filled out some things but not all.

## 2010-06-20 LAB — FIBRINOGEN: Fibrinogen: 402 mg/dL (ref 204–475)

## 2010-06-20 LAB — BASIC METABOLIC PANEL
Calcium: 9.5 mg/dL (ref 8.4–10.5)
Chloride: 104 mEq/L (ref 96–112)
Chloride: 110 mEq/L (ref 96–112)
Creatinine, Ser: 1.29 mg/dL (ref 0.4–1.5)
Creatinine, Ser: 1.33 mg/dL (ref 0.4–1.5)
GFR calc Af Amer: >60 mL/min (ref >60)
GFR calc Af Amer: >60 mL/min (ref >60)
GFR calc non Af Amer: 51 mL/min — ABNORMAL LOW (ref >60)
GFR calc non Af Amer: 53 mL/min — ABNORMAL LOW (ref >60)
Potassium: 4.2 mEq/L (ref 3.5–5.1)

## 2010-06-20 LAB — CBC
HCT: 41.8 % (ref 39.0–52.0)
Hemoglobin: 14.9 g/dL (ref 13.0–17.0)
MCHC: 33.8 g/dL (ref 30.0–36.0)
MCV: 106.2 fL — ABNORMAL HIGH (ref 78.0–100.0)
Platelets: 104 10*3/uL — ABNORMAL LOW (ref 150–400)
Platelets: 108 10*3/uL — ABNORMAL LOW (ref 150–400)
Platelets: 95 10*3/uL — ABNORMAL LOW (ref 150–400)
RBC: 3.93 MIL/uL — ABNORMAL LOW (ref 4.22–5.81)
RBC: 4.21 MIL/uL — ABNORMAL LOW (ref 4.22–5.81)
WBC: 4.9 10*3/uL (ref 4.0–10.5)
WBC: 6.4 10*3/uL (ref 4.0–10.5)

## 2010-06-20 LAB — SAVE SMEAR

## 2010-06-20 LAB — DIFFERENTIAL
Basophils Absolute: 0 10*3/uL (ref 0.0–0.1)
Basophils Relative: 0 % (ref 0–1)
Eosinophils Absolute: 0.1 10*3/uL (ref 0.0–0.7)
Lymphocytes Relative: 15 % (ref 12–46)
Lymphs Abs: 1 10*3/uL (ref 0.7–4.0)
Monocytes Relative: 9 % (ref 3–12)
Neutro Abs: 5.6 10*3/uL (ref 1.7–7.7)
Neutrophils Relative %: 76 % (ref 43–77)
Neutrophils Relative %: 79 % — ABNORMAL HIGH (ref 43–77)

## 2010-06-20 LAB — ANA: Anti Nuclear Antibody(ANA): POSITIVE — AB

## 2010-06-20 LAB — MRSA PCR SCREENING: MRSA by PCR: NEGATIVE

## 2010-06-20 LAB — D-DIMER, QUANTITATIVE: D-Dimer, Quant: <0.22 ug/mL-FEU (ref 0.00–0.48)

## 2010-06-20 LAB — PROTIME-INR
INR: 2.28 — ABNORMAL HIGH (ref 0.00–1.49)
INR: 2.43 — ABNORMAL HIGH (ref 0.00–1.49)
Prothrombin Time: 24.9 seconds — ABNORMAL HIGH (ref 11.6–15.2)

## 2010-06-20 LAB — ANTI-NUCLEAR AB-TITER (ANA TITER): ANA Titer 1: NEGATIVE

## 2010-06-20 LAB — SEDIMENTATION RATE: Sed Rate: 4 mm/hr (ref 0–16)

## 2010-06-30 ENCOUNTER — Encounter: Payer: Self-pay | Admitting: Internal Medicine

## 2010-07-01 ENCOUNTER — Ambulatory Visit (INDEPENDENT_AMBULATORY_CARE_PROVIDER_SITE_OTHER): Payer: Medicare Other | Admitting: Internal Medicine

## 2010-07-01 ENCOUNTER — Encounter: Payer: Self-pay | Admitting: Internal Medicine

## 2010-07-01 DIAGNOSIS — I82409 Acute embolism and thrombosis of unspecified deep veins of unspecified lower extremity: Secondary | ICD-10-CM

## 2010-07-01 DIAGNOSIS — N259 Disorder resulting from impaired renal tubular function, unspecified: Secondary | ICD-10-CM

## 2010-07-01 DIAGNOSIS — I2699 Other pulmonary embolism without acute cor pulmonale: Secondary | ICD-10-CM

## 2010-07-01 DIAGNOSIS — I2510 Atherosclerotic heart disease of native coronary artery without angina pectoris: Secondary | ICD-10-CM

## 2010-07-01 LAB — POCT INR: INR: 2.7

## 2010-07-01 NOTE — Assessment & Plan Note (Signed)
Patient has no symptoms. Will not alter medications at this time.

## 2010-07-01 NOTE — Progress Notes (Signed)
  Subjective:    Patient ID: [name], male    DOB: 12-30-1919, 77 y.o.   MRN: [id number]  HPI  htn--no sxs, tolerating meds DVT---tolerating warfarin without any known bleeding PE--no sxs of recurrence Renal Insufficiency-has regular f/u  Past Medical History  Diagnosis Date  . Myocardial infarction   . Peptic ulcer disease   . Hyperlipidemia   . Hypertension   . Hx pulmonary embolism   . Anemia   . CHF (congestive heart failure)   . Deep vein thrombosis (DVT)   . Stroke   . Hypotension     reaction to coreg   Past Surgical History  Procedure Date  . Appendectomy   . Cataract extraction   . Gastrectomy   . Nephrectomy     benign kidney tumor  . Transluminal angioplasty     reports that he has never smoked. He does not have any smokeless tobacco history on file. He reports that he does not drink alcohol or use illicit drugs. family history includes Cirrhosis in his son; Diabetes in his mother; Leukemia in his sister; and Stroke in his mother. Allergies  Allergen Reactions  . Morphine Sulfate     REACTION: unspecified     Review of Systems  patient denies chest pain, shortness of breath, orthopnea. Denies lower extremity edema, abdominal pain, change in appetite, change in bowel movements. Patient denies rashes, musculoskeletal complaints (outside of chronic aches and pains). No other specific complaints in a complete review of systems.      Objective:   Physical Exam  well-developed well-nourished male in no acute distress. HEENT exam atraumatic, normocephalic, neck supple without jugular venous distention. Chest clear to auscultation cardiac exam S1-S2 are regular. Abdominal exam overweight with bowel sounds, soft and nontender. Extremities no edema--bilateral varicose veins. . Neurologic exam is alert with an abnormal gait---uses walker        Assessment & Plan:

## 2010-07-01 NOTE — Assessment & Plan Note (Signed)
Has regular f/u  

## 2010-07-01 NOTE — Patient Instructions (Signed)
Coumadin dose- 5mg . Every day

## 2010-07-01 NOTE — Assessment & Plan Note (Signed)
No recurrence Continue warfarin for recurrent dzs

## 2010-07-06 ENCOUNTER — Other Ambulatory Visit: Payer: Self-pay | Admitting: *Deleted

## 2010-07-06 MED ORDER — METOPROLOL TARTRATE 25 MG PO TABS: ORAL_TABLET | ORAL | Status: DC

## 2010-07-12 LAB — HOMOCYSTEINE: Homocysteine: 10.1 umol/L (ref 4.0–15.4)

## 2010-07-12 LAB — URINALYSIS, DIPSTICK ONLY
Bilirubin Urine: NEGATIVE
Ketones, ur: NEGATIVE mg/dL
Nitrite: NEGATIVE
Protein, ur: NEGATIVE mg/dL
Urobilinogen, UA: 1 mg/dL (ref 0.0–1.0)

## 2010-07-12 LAB — URINALYSIS, ROUTINE W REFLEX MICROSCOPIC
Bilirubin Urine: NEGATIVE
Ketones, ur: NEGATIVE mg/dL
Nitrite: POSITIVE — AB
Specific Gravity, Urine: 1.014 (ref 1.005–1.030)
Urobilinogen, UA: 1 mg/dL (ref 0.0–1.0)
pH: 7 (ref 5.0–8.0)

## 2010-07-12 LAB — BASIC METABOLIC PANEL WITH GFR
BUN: 21 mg/dL (ref 6–23)
CO2: 24 meq/L (ref 19–32)
Calcium: 9.7 mg/dL (ref 8.4–10.5)
Creatinine, Ser: 1.49 mg/dL (ref 0.4–1.5)
GFR calc Af Amer: 54 mL/min — ABNORMAL LOW (ref >60)
Glucose, Bld: 173 mg/dL — ABNORMAL HIGH (ref 70–99)

## 2010-07-12 LAB — CBC
HCT: 36.7 % — ABNORMAL LOW (ref 39.0–52.0)
HCT: 39.9 % (ref 39.0–52.0)
HCT: 43.7 % (ref 39.0–52.0)
Hemoglobin: 14.8 g/dL (ref 13.0–17.0)
MCHC: 33.9 g/dL (ref 30.0–36.0)
MCHC: 34 g/dL (ref 30.0–36.0)
MCHC: 34.5 g/dL (ref 30.0–36.0)
MCHC: 34.6 g/dL (ref 30.0–36.0)
MCHC: 33.9 g/dL (ref 30.0–36.0)
MCV: 105.6 fL — ABNORMAL HIGH (ref 78.0–100.0)
MCV: 106.5 fL — ABNORMAL HIGH (ref 78.0–100.0)
MCV: 106.5 fL — ABNORMAL HIGH (ref 78.0–100.0)
MCV: 106.9 fL — ABNORMAL HIGH (ref 78.0–100.0)
MCV: 104.5 fL — ABNORMAL HIGH (ref 78.0–100.0)
Platelets: 100 10*3/uL — ABNORMAL LOW (ref 150–400)
Platelets: 95 10*3/uL — ABNORMAL LOW (ref 150–400)
Platelets: 106 10*3/uL — ABNORMAL LOW (ref 150–400)
RBC: 3.47 MIL/uL — ABNORMAL LOW (ref 4.22–5.81)
RBC: 4.08 MIL/uL — ABNORMAL LOW (ref 4.22–5.81)
RBC: 4.18 MIL/uL — ABNORMAL LOW (ref 4.22–5.81)
RDW: 14.7 % (ref 11.5–15.5)
RDW: 15 % (ref 11.5–15.5)
RDW: 15.3 % (ref 11.5–15.5)
RDW: 14.7 % (ref 11.5–15.5)
WBC: 5.7 10*3/uL (ref 4.0–10.5)
WBC: 6.7 10*3/uL (ref 4.0–10.5)
WBC: 8.2 10*3/uL (ref 4.0–10.5)
WBC: 8.7 10*3/uL (ref 4.0–10.5)

## 2010-07-12 LAB — BASIC METABOLIC PANEL
BUN: 21 mg/dL (ref 6–23)
BUN: 24 mg/dL — ABNORMAL HIGH (ref 6–23)
BUN: 26 mg/dL — ABNORMAL HIGH (ref 6–23)
Calcium: 9 mg/dL (ref 8.4–10.5)
Chloride: 111 mEq/L (ref 96–112)
Chloride: 109 mEq/L (ref 96–112)
Creatinine, Ser: 1.33 mg/dL (ref 0.4–1.5)
Creatinine, Ser: 1.35 mg/dL (ref 0.4–1.5)
GFR calc non Af Amer: 50 mL/min — ABNORMAL LOW (ref >60)
GFR calc non Af Amer: 51 mL/min — ABNORMAL LOW (ref >60)
GFR calc non Af Amer: 45 mL/min — ABNORMAL LOW (ref >60)
Glucose, Bld: 101 mg/dL — ABNORMAL HIGH (ref 70–99)
Glucose, Bld: 119 mg/dL — ABNORMAL HIGH (ref 70–99)
Potassium: 4.1 mEq/L (ref 3.5–5.1)
Potassium: 4.5 mEq/L (ref 3.5–5.1)
Potassium: 4 mEq/L (ref 3.5–5.1)
Sodium: 139 mEq/L (ref 135–145)

## 2010-07-12 LAB — LUPUS ANTICOAGULANT PANEL
PTTLA 4:1 Mix: 61.6 secs — ABNORMAL HIGH (ref 36.3–48.8)
PTTLA Confirmation: 9.5 secs — ABNORMAL HIGH (ref <8.0)

## 2010-07-12 LAB — URINE CULTURE

## 2010-07-12 LAB — TROPONIN I
Troponin I: 0.01 ng/mL (ref 0.00–0.06)
Troponin I: 0.01 ng/mL (ref 0.00–0.06)

## 2010-07-12 LAB — PROTIME-INR
INR: 1.6 — ABNORMAL HIGH (ref 0.00–1.49)
INR: 1.7 — ABNORMAL HIGH (ref 0.00–1.49)
INR: 2.1 — ABNORMAL HIGH (ref 0.00–1.49)
INR: 2.2 — ABNORMAL HIGH (ref 0.00–1.49)
INR: 3 — ABNORMAL HIGH (ref 0.00–1.49)
INR: 3.1 — ABNORMAL HIGH (ref 0.00–1.49)
Prothrombin Time: 18.3 seconds — ABNORMAL HIGH (ref 11.6–15.2)
Prothrombin Time: 18.3 seconds — ABNORMAL HIGH (ref 11.6–15.2)
Prothrombin Time: 19.6 seconds — ABNORMAL HIGH (ref 11.6–15.2)
Prothrombin Time: 21.1 seconds — ABNORMAL HIGH (ref 11.6–15.2)
Prothrombin Time: 24.4 seconds — ABNORMAL HIGH (ref 11.6–15.2)
Prothrombin Time: 25.9 seconds — ABNORMAL HIGH (ref 11.6–15.2)
Prothrombin Time: 30.5 seconds — ABNORMAL HIGH (ref 11.6–15.2)
Prothrombin Time: 34.4 seconds — ABNORMAL HIGH (ref 11.6–15.2)

## 2010-07-12 LAB — DIFFERENTIAL
Basophils Absolute: 0 K/uL (ref 0.0–0.1)
Basophils Relative: 0 % (ref 0–1)
Eosinophils Absolute: 0 10*3/uL (ref 0.0–0.7)
Eosinophils Relative: 0 % (ref 0–5)
Lymphocytes Relative: 6 % — ABNORMAL LOW (ref 12–46)
Lymphs Abs: 0.5 10*3/uL — ABNORMAL LOW (ref 0.7–4.0)
Monocytes Absolute: 0.7 10*3/uL (ref 0.1–1.0)
Monocytes Relative: 8 % (ref 3–12)
Neutro Abs: 7.5 K/uL (ref 1.7–7.7)
Neutrophils Relative %: 86 % — ABNORMAL HIGH (ref 43–77)

## 2010-07-12 LAB — COMPREHENSIVE METABOLIC PANEL
AST: 18 U/L (ref 0–37)
Albumin: 3 g/dL — ABNORMAL LOW (ref 3.5–5.2)
Calcium: 9.3 mg/dL (ref 8.4–10.5)
Creatinine, Ser: 1.49 mg/dL (ref 0.4–1.5)
GFR calc Af Amer: 54 mL/min — ABNORMAL LOW (ref >60)

## 2010-07-12 LAB — POCT CARDIAC MARKERS
CKMB, poc: 1.1 ng/mL (ref 1.0–8.0)
CKMB, poc: 1.2 ng/mL (ref 1.0–8.0)
Myoglobin, poc: 122 ng/mL (ref 12–200)
Myoglobin, poc: 179 ng/mL (ref 12–200)
Troponin i, poc: <0.05 ng/mL (ref 0.00–0.09)
Troponin i, poc: <0.05 ng/mL (ref 0.00–0.09)

## 2010-07-12 LAB — URINE MICROSCOPIC-ADD ON

## 2010-07-12 LAB — BETA-2-GLYCOPROTEIN I ABS, IGG/M/A
Beta-2 Glyco I IgG: 8 U/mL (ref <20)
Beta-2-Glycoprotein I IgA: 5 U/mL (ref <10)
Beta-2-Glycoprotein I IgM: <4 U/mL (ref <10)

## 2010-07-12 LAB — LIPID PANEL
HDL: 35 mg/dL — ABNORMAL LOW (ref >39)
Total CHOL/HDL Ratio: 3 RATIO
Triglycerides: 71 mg/dL (ref <150)
VLDL: 14 mg/dL (ref 0–40)

## 2010-07-12 LAB — FACTOR 5 LEIDEN

## 2010-07-12 LAB — APTT: aPTT: 30 s (ref 24–37)

## 2010-07-12 LAB — HEPARIN LEVEL (UNFRACTIONATED)
Heparin Unfractionated: 0.49 IU/mL (ref 0.30–0.70)
Heparin Unfractionated: 0.54 IU/mL (ref 0.30–0.70)
Heparin Unfractionated: 0.66 IU/mL (ref 0.30–0.70)

## 2010-07-12 LAB — TYPE AND SCREEN: ABO/RH(D): O POS

## 2010-07-12 LAB — CK TOTAL AND CKMB (NOT AT ARMC)
CK, MB: 1.9 ng/mL (ref 0.3–4.0)
Relative Index: INVALID (ref 0.0–2.5)
Relative Index: INVALID (ref 0.0–2.5)
Total CK: 33 U/L (ref 7–232)

## 2010-07-12 LAB — PROTEIN C ACTIVITY: Protein C Activity: 155 % — ABNORMAL HIGH (ref 75–133)

## 2010-07-12 LAB — PROTEIN S ACTIVITY: Protein S Activity: 127 % (ref 69–129)

## 2010-07-12 LAB — TSH
TSH: 2.549 u[IU]/mL (ref 0.350–4.500)
TSH: 0.724 u[IU]/mL (ref 0.350–4.500)

## 2010-07-30 ENCOUNTER — Ambulatory Visit: Payer: Medicare Other

## 2010-07-30 DIAGNOSIS — I2699 Other pulmonary embolism without acute cor pulmonale: Secondary | ICD-10-CM

## 2010-07-30 DIAGNOSIS — I82409 Acute embolism and thrombosis of unspecified deep veins of unspecified lower extremity: Secondary | ICD-10-CM

## 2010-07-30 LAB — POCT INR: INR: 1.7

## 2010-07-30 NOTE — Patient Instructions (Signed)
Same dose 

## 2010-08-17 NOTE — Assessment & Plan Note (Signed)
[facility]                            CARDIOLOGY OFFICE NOTE   NAME:[name], [name]                       MRN:          [id number]  DATE:01/03/2007                            DOB:          27-Feb-1920    CARDIOLOGIST:  Dr. Charlies Constable.   PRIMARY CARE PHYSICIAN:  Dr. Birdie Sons.   REASON FOR VISIT:  Left shoulder pain.   HISTORY OF PRESENT ILLNESS:  [name] is an 77 year old male patient  with a history of coronary artery disease status post anterior wall  myocardial infarction treated with streptokinase in 1987 with subsequent  angioplasty of the LAD as well as a history of stenting to the RCA in  1994 who presents to the office today for complaint of left shoulder  pain.   The patient notes over the last week he has had some shoulder aching on  the left.  He says the last time he had this was when he had his  myocardial infarction in 1987.  He really denies any exertional  symptoms.  He really denies any symptoms that are completely reminiscent  of his myocardial infarction.  When he had his MI he did have  significant chest pain.  He has not had any of that.  He has also not  had any shortness of breath.  Denies any associated nausea or  diaphoresis.  Denies any syncope.  He denies any orthopnea or PND.  He  thinks he has had some pedal edema.  His calves seems to cramp somewhat.  He denies any exertional lower extremity pain.   CURRENT MEDICATIONS:  1. Coumadin as directed.  2. Simvastatin 40 mg nightly.  3. Allopurinol 300 mg daily.  4. Atenolol 50 mg daily.  5. Aspirin 81 mg daily.  6. Amlodipine 2.5 mg a day.  7. Vitamin C.  8. Iron.   SOCIAL HISTORY:  Denies any tobacco abuse.   REVIEW OF SYSTEMS:  Please see HPI.  Denies any fever, chills, cough,  melena, hematochezia, hematuria, dysuria.  Rest of review of systems are  negative.   PHYSICAL EXAMINATION:  He is a well-nourished, well-developed elderly  male in no acute  distress.  Blood pressure is 118/72, pulse 64, weight  166 pounds.  HEENT:  Normal.  NECK:  Without JVD at 45 degrees.  CARDIAC:  Normal S1-S2, regular rate and rhythm without murmur.  LUNGS:  Clear to auscultation bilaterally without wheezing, rhonchi or  rales.  ABDOMEN:  Soft, nontender with normoactive bowel sounds, no  organomegaly.  EXTREMITIES:  With trace ankle edema bilaterally.  Calves were soft with  prominent veins bilaterally, no significant tenderness.  VASCULAR:  Femoral artery pulses were 2+ bilaterally without bruits,  dorsalis pedis and posterior tibial pulses as well as popliteal pulses  were present bilaterally.  NEUROLOGIC:  He is alert and oriented x3, cranial nerves II-XII grossly  intact.   Electrocardiogram with sinus rhythm, heart rate of 64, left axis  deviation, Q waves in 3 and aVF as well as V1-4, no ischemic changes.   IMPRESSION:  1. Left shoulder pain.  a.     Rule out anginal equivalent.  2. Mild pedal edema.  3. Coronary artery disease.      a.     Status post anterior wall myocardial infarction 1987 treated       with streptokinase and subsequent angioplasty of the left anterior       descending.      b.     Status post stenting of the right coronary artery in 1994.  4. Good left ventricular function.  5. Hyperlipidemia.  6. History of left nephrectomy.  7. History of transient ischemic attack in 2000,  8. History of recurrent deep venous thrombosis and pulmonary embolism      2004.      a.     Chronic Coumadin therapy.  9. Anemia.      a.     Recent colonoscopy and endoscopy normal.   PLAN:  The patient presents to the office today for further evaluation  of left shoulder pain.  He has had this off and on over the last week.  It has mainly been at night.  It is not getting any worse and actually  seems to be getting better.  He is not using nitroglycerin for this.  He  was a little concerned because it reminded him somewhat of the  pain he  had with his myocardial infarction 1987.  He has also had some mild  pedal edema and cramping.  He denies any symptoms consistent with  claudication.  I discussed his case today with Dr. Juanda Chance.  At this  point in time we plan to:  1. Proceed with adenosine Myoview study to rule out the possibility of      ischemia.  2. Check a BMET and a BNP.  3. He will follow up with Dr. Juanda Chance in the next 2-3 weeks to review      the above testing.  He knows to return sooner if there is any      change in his symptoms.      William Newcomer, PA-C  Electronically Signed      Everardo Beals. Juanda Chance, MD, [facility]  Electronically Signed   SW/MedQ  DD: 01/03/2007  DT: 01/04/2007  Job #: [id number]

## 2010-08-17 NOTE — Assessment & Plan Note (Signed)
[facility]                            CARDIOLOGY OFFICE NOTE   NAME:[name], [name]                       MRN:          [id number]  DATE:10/18/2007                            DOB:          02/19/1920    PRIMARY CARE PHYSICIAN:  Valetta Mole. Swords, MD   CLINICAL HISTORY:  [name] is 77 years old and returned for followup  management of his coronary heart disease.  He had remote anterior wall  myocardial infarction treated with streptokinase and subsequent PTCA to  the LAD in 1987.  He had stenting of the right coronary artery in 1994.  He had a Myoview scan last fall, which showed an ejection fraction of  34%.  I saw him in March and because of his low ejection fraction, I  adjusted his medicine, and started him on carvedilol instead of  atenolol.  We also got an echocardiogram, and surprisingly, his ejection  fraction was 45% by echocardiogram.  He subsequently saw Dr. Birdie Sons, who had to stop his carvedilol because of low blood pressure.   He says he has been feeling well.  He has had no chest pain, shortness  breath, or palpitations.  He has had some right calf pain not related to  activity.   PAST MEDICAL HISTORY:  Significant for deep vein thrombophlebitis and  pulmonary embolus in 2004.  He also has had a previous nephrectomy for  hypernephroma.  He has hyperlipidemia, history of TIA, and hypertension.   CURRENT MEDICATIONS:  1. Coumadin.  2. Simvastatin.  3. Allopurinol.  4. Aspirin.  5. Furosemide 20 g daily.  6. Lisinopril 10 mg daily.   PHYSICAL EXAMINATION:  VITAL SIGNS:  Today, the blood pressure was 96/64  and pulse 85 and regular.  There was no venous distension.  NECK:  Carotid pulses were full without bruits.  CHEST:  Clear.  CARDIAC:  Rhythm was regular.  I could hear no murmurs, clicks, or  gallops.  ABDOMEN:  Soft without organomegaly.  EXTREMITIES:  Peripheral pulses are full with no peripheral edema.  The  right  lower extremity had no evidence of phlebitis.   IMPRESSION:  1. Coronary artery disease status post remote anterior wall myocardial      infarction treated with streptokinase and subsequent percutaneous      transluminal coronary angioplasty with subsequent stenting of the      right coronary artery in 1994.  2. Ischemic cardiomyopathy with ejection fraction of 34% by Myoview in      2008, but 45% by echo in March 2009.  3. History of deep vein thrombophlebitis.  4. History of transient ischemic attack.  5. Hypertension.  6. Hyperlipidemia.  7. History of anemia with negative gastrointestinal workup.  8. Intolerance to Coreg due to hypotension.   RECOMMENDATIONS:  I think, [name] is doing well.  He was not able to  tolerate the Coreg, but his EF is much better on echo than it was on the  Myoview scan, so I think this is less crucial.  We will get a BMP on him  today, and I will see him back in followup in a year.     Bruce Elvera Lennox Juanda Chance, MD, [facility]  Electronically Signed    BRB/MedQ  DD: 10/18/2007  DT: 10/19/2007  Job #: [id number]

## 2010-08-17 NOTE — Assessment & Plan Note (Signed)
[facility]                            CARDIOLOGY OFFICE NOTE   NAME:[name], [name]                       MRN:          [id number]  DATE:06/19/2007                            DOB:          [birth year]    PRIMARY CARE PHYSICIAN:  Dr. Birdie Sons.   CLINICAL HISTORY:  Mr. Mounts is an 77 year old and returns for  management of his coronary heart disease.  He has had a remote anterior  wall myocardial infarction treated with streptokinase and subsequent  PTCA to LAD in 1987 and had a stenting of the right coronary in 1994.  He had a recent Myoview scan last fall which showed no ischemia but  ejection fraction was 34%.   He had no chest pain, no palpitations.  With his normal level activity,  he does not have shortness of breath, only if he exerts himself more  strenuously.   PAST MEDICAL HISTORY:  1. Deep vein thrombophlebitis and pulmonary embolus in 2004.  2. History of TIA.  3. Hyperlipidemia.  4. Previous nephrectomy, I believe, for hypernephroma.  5. History of hypertension and anemia with negative GI workup.   CURRENT MEDICATIONS:  Coumadin, simvastatin, allopurinol, Atenolol,  aspirin, amlodipine and furosemide 20 mg daily.  [facility], but there were no apparent side effects why this  was stopped.   PHYSICAL EXAMINATION:  VITAL SIGNS:  Blood pressure is 94/65, pulse 69  and regular.  There was no venous distension.  NECK:  The carotid pulses were full without bruits.  CHEST:  Clear without rales or rhonchi.  HEART:  Rhythm is regular.  Heart sounds were normal and no murmurs or  gallops.  ABDOMEN:  Soft, normal bowel sounds.  There was no hepatosplenomegaly.  There was trace peripheral edema.  Pedal pulses were equal.   STUDIES:  Electrocardiogram showed an anterior wall infarction and left  axis deviation and nonspecific ST-T changes.   IMPRESSION:  1. Coronary artery status post remote antral  myocardial treated with      streptokinase and subsequent PTCA in the LAD in 1997 with      subsequent stenting in the right coronary 1994.  2. Ischemic cardiomyopathy with ejection fraction 34% by last Myoview      2008.  3. Class II CHF.  4. History of deep vein thrombophlebitis and pulmonary embolism in      2004 - Coumadin therapy.  5. History of TIA 2000.  6. Hyperlipidemia.  7. Hypertension.  8. History of anemia with a negative GI workup.   RECOMMENDATIONS:  Mr. Fei is doing fairly well from a symptomatic  standpoint.  His LV function has gotten worse over time, and I think his  current therapy is not optimal for this.  Will switch him from  amlodipine 2.5 daily to Lisinopril 10 mg daily.  Also, switch him from  Atenolol 50 mg daily to Coreg 12.5 mg b.i.d.  Will have him return for a  follow-up echocardiogram in 2 weeks and will get a CBC, BMP and BNP at  that time.  I will plan to see him back in 3 months.     Bruce Elvera Lennox Juanda Chance, MD, [facility]  Electronically Signed    BRB/MedQ  DD: 06/19/2007  DT: 06/20/2007  Job #: [id number]

## 2010-08-17 NOTE — H&P (Signed)
NAMEEZREAL, TURAY NO.:  [id number]   MEDICAL RECORD NO.:  [id number]          PATIENT TYPE:  EMS   LOCATION:  MAJO                         FACILITY:  [facility]   PHYSICIAN:  Rollene Rotunda, MD, FACCDATE OF BIRTH:  [birth year]   DATE OF ADMISSION:  09/14/2008  DATE OF DISCHARGE:                              HISTORY & PHYSICAL   CARDIOLOGIST:  Dr. Charlies Constable.   PRIMARY CARE PHYSICIAN:  Dr. Birdie Sons.   REASON FOR ADMISSION:  Chest pain.   HISTORY OF PRESENT ILLNESS:  Mr. Tuller is an 77 year old male patient  followed by Dr. Juanda Chance with a history of coronary disease status post  remote anterior wall myocardial infarction 1987 treated with  streptokinase and eventual PTCA of the LAD and subsequent stenting of  the RCA in 1994 who last underwent ischemic workup in October 2008 with  a Myoview study demonstrating no ischemia, but reduced ejection fraction  of 34%.  Followup echocardiography in March 2009 demonstrated an EF 45%.  He had previously been on lisinopril as well as Coreg but these  medications were discontinued secondary to side effects and hypotension.  He was in his usual state of health until today around 9:13 in the  morning when he developed substernal chest discomfort.  He describes as  a pressure or tightness.  He was at rest when it began.  The symptoms  lasted until he presented to the emergency room.  He did not use any  nitroglycerin at home.  Since arriving to the emergency room, his pain  is improved on nitro paste.  Initially he described it as a 9/10 on a 10-  point scale.  Currently his pain is 4/10.  He did have some slight  nausea but denies any associated shortness of breath, diaphoresis or  radiating symptoms.  His electrocardiogram demonstrates inferior  anterior Q-waves in no ischemic changes.  Of note, he says that his  chest pain is just like his previous pain from his myocardial infarction  in 1987.   PAST MEDICAL  HISTORY:  1. Coronary artery disease.      a.     Status post answer wall myocardial infarction treated with       streptokinase and PTCA of the LAD in 1987.      b.     Status post any to the RCA in 1994.      c.     Myoview study October 2008 with EF 34%, no ischemia.  2. Ischemic cardiomyopathy with an EF 45% by echocardiogram March      2009.  3. Peptic ulcer disease status post partial gastrectomy in the past.  4. Gout.  5. Dyslipidemia.  6. History of left nephrectomy secondary to hypernephroma.  7. Chronic kidney disease. Previous creatinine in July 2009 1.5.  8. History of TIA in 2000.  9. History of recurrent DVT/pulmonary embolism on chronic Coumadin      therapy.  10.History of hypertension.  11.History of anemia on iron therapy.  12.Status post appendectomy.  13.Status post back surgery.   MEDICATIONS AT HOME:  1.  Coumadin - this is followed by his primary care physician.  2. Simvastatin 40 mg q.h.s.  3. Aspirin 81 mg daily.  4. Allopurinol 300 mg daily.  5. Oxycodone/APAP p.r.n.  6. Ferrous sulfate 325 mg b.i.d.  7. Vitamin C 1000 mg daily.   ALLERGIES:  COREG causes hypotension.  MORPHINE causes significant side  effects.   SOCIAL HISTORY:  The patient lives in [place] with his wife whom he  has been married to for 60+ years.  Denies any tobacco or alcohol abuse.  He is retired.  Served in the [place] during [employer] War [employer].  His  daughter is a Marine scientist at the [employer] in [place], and his  son-in-law is an anesthesiologist at [employer].   FAMILY HISTORY:  No premature CAD.   REVIEW OF SYSTEMS:  Please see HPI.  Denies fevers, sore throat.  He has  had a headache.  Denies rash.  Denies any dyspnea on exertion,  orthopnea, PND or pedal edema, palpitations, syncope, near-syncope,  cough, dysuria, hematuria, bright red blood per rectum or melena.  He  does have dark stools from taking iron.  There has been no significant  change.  All  other systems reviewed and negative.   PHYSICAL EXAM:  CONSTITUTIONAL:  He is a well-nourished, well-developed  elderly male in no acute distress.  VITAL SIGNS:  Blood pressure is 121/74, pulse of 88, respirations 20,  temperature 98.1, oxygen saturation 99% room air.  HEENT:  Normal.  NECK:  Without JVD.  LYMPHATICS:  Without lymphadenopathy.  ENDOCRINE:  Without thyromegaly.  CARDIAC:  S1, S2.  Regular rate and rhythm without murmur.  LUNGS:  Clear to auscultation bilaterally without wheezing, rhonchi or  rales.  ABDOMEN:  Soft, nontender with normoactive bowel sounds.  No  organomegaly.  EXTREMITIES:  Without clubbing, cyanosis or edema.  SKIN:  Warm and dry.  MUSCULOSKELETAL:  Without joint deformity.  NEUROLOGIC:  He is alert and oriented and oriented x3.  Cranial nerves  II-XII grossly intact.  VASCULAR:  No carotid bruits bilaterally.  Dorsalis pedis and posterior  tibialis pulses are 2+ bilaterally.   CHEST X-RAY:  No active disease.   EKG:  Sinus tachycardia, heart rate of 104.  Left axis deviation,  inferior and anterior Q-waves.  T-wave inversion I and aVL similar to  previous tracing dated from 2005.  No ischemic changes.  No significant  changes.   LABS:  Hemoglobin 14.8, potassium 4, creatinine 0.49, glucose 173.  Point of care markers negative times one.  INR 2.2.   ASSESSMENT:  1. Chest pain in an 77 year old male with history of coronary artery      disease status post an to wall myocardial infarction in 1987      treated with streptokinase and angioplasty of the left anterior      descending and subsequent stenting to the right coronary artery in      1994 with most recent ischemic evaluation October 2008 with a      nonischemic Myoview study.  His symptoms are concerning for      unstable angina pectoris.  2. Ischemic cardiomyopathy with an ejection fraction of 45%.  3. Hyperlipidemia.  4. History of recurrent deep vein thrombosis/pulmonary emboli on       chronic Coumadin therapy.  5. Anemia.  6. Mild renal insufficiency with a history of solitary kidney      secondary to nephrectomy for hypernephroma.   RECOMMENDATIONS:  The patient was also interviewed  and examined by Dr.  Antoine Poche.  He presents with symptoms concerning for unstable angina  pectoris.  He will be admitted and placed on IV nitroglycerin in lieu  nitro paste.  He will also be continued on aspirin.  He has had some  intolerance to Coreg in the past.  We will try to treat him with a very  low dose metoprolol.  Hopefully he can tolerate this.  His simvastatin  will be continued.  His Coumadin will be placed on hold.  Heparin will  be started once INR is less than 2.  Hopefully he can be taken to the  cardiac catheterization lab once his INR is less than 1.7.  He will be  gently hydrated secondary to history of solitary kidney and mild renal  insufficiency.  Followup metabolic panel will be obtained tomorrow.  TSH  and fasting lipids will also be obtained.  Further recommendations to  follow once his cardiac catheterization is complete.      Tereso Newcomer, PA-C      Rollene Rotunda, MD, [facility]  Electronically Signed    SW/MEDQ  D:  09/14/2008  T:  09/14/2008  Job:  [id number]   cc:   Everardo Beals. Juanda Chance, MD, [facility]  Bruce H. Swords, MD

## 2010-08-17 NOTE — Cardiovascular Report (Signed)
[name], [name]                ACCOUNT NO.:  [id number]   MEDICAL RECORD NO.:  [id number]           PATIENT TYPE:   LOCATION:                                 FACILITY:   PHYSICIAN:  Verne Carrow, MDDATE OF BIRTH:  07-04-1919   DATE OF PROCEDURE:  09/17/2008  DATE OF DISCHARGE:                            CARDIAC CATHETERIZATION   PRIMARY CARDIOLOGIST:  Everardo Beals. Juanda Chance, MD, [facility]   PROCEDURE PERFORMED:  Percutaneous coronary intervention with placement  of a XIENCE drug-eluting stent in the second obtuse marginal coronary  artery.   OPERATOR:  Verne Carrow, MD   INDICATIONS:  An 77 year old Caucasian male with a history of known  coronary artery disease who presented to the hospital with chest pain on  September 14, 2008, and was found by diagnostic heart catheterization today  to have severe stenosis in the second obtuse marginal branch.  The  patient's diagnostic procedure was performed by Dr. Rollene Rotunda.  I  was asked to perform the percutaneous coronary intervention this  afternoon.   DETAILS OF PROCEDURE:  The patient was brought from his room this  afternoon back to the cath lab with a sheath in the right femoral  artery.  He was placed flat on the cath table, and the sheath was  sterilely exchanged for a 6-French sheath.  A bolus of Angiomax was  given, and a drip was started.  An XP 3.5 guiding catheter was used to  selectively engage the left main coronary artery.  A Cougar  intracoronary wire was then passed down the circumflex into the second  obtuse marginal branch beyond the area of tightest stenosis.  A 2.5- x  15-mm balloon was inflated in 2 different locations inside this vessel.  This balloon was removed, and a 2.5- x 28-mm XIENCE drug-eluting stent  was deployed in the second obtuse marginal coronary artery.  A 2.75- x  20-mm noncompliant balloon was then inflated inside the stent in 2  overlapping segments.  The noncompliant balloon was taken  to 16  atmospheres in the proximal portion of the stented segment and 14  atmospheres in the distal portion of the stented segment.  There was an  excellent angiographic result.  There was TIMI III flow both before and  after the intervention.  The patient tolerated the procedure well, was  taken to the holding area in stable condition.   IMPRESSION:  Successful percutaneous coronary intervention of the second  obtuse marginal coronary artery with placement of a XIENCE drug-eluting  stent.   RECOMMENDATIONS:  The decision was made to place a drug-eluting stent  secondary to the small vessel size and the length of the lesion.  The  patient should be continued on aspirin and Plavix for at least 1 year.  He does have a history of chronic DVTs and pulmonary emboli in the past  and is on chronic Coumadin therapy.  We will resume his heparin drip  tonight, 6 hours after the sheath is pulled.  We will also resume his  Coumadin tonight.  Because of  his increased risk of bleeding on  these 3 agents, he will be treated  with a baby aspirin as well as the Plavix and the Coumadin.  Consideration can be given by his cardiologist in regards to stopping  the aspirin and continuing the Plavix and Coumadin only.      Verne Carrow, MD  Electronically Signed     CM/MEDQ  D:  09/17/2008  T:  09/18/2008  Job:  [id number]   cc:   Everardo Beals. Juanda Chance, MD, [facility]

## 2010-08-17 NOTE — Cardiovascular Report (Signed)
[name], [name]                ACCOUNT NO.:  [id number]   MEDICAL RECORD NO.:  [id number]          PATIENT TYPE:  INP   LOCATION:  2506                         FACILITY:  [facility]   PHYSICIAN:  Rollene Rotunda, MD, FACCDATE OF BIRTH:  [birth year]   DATE OF PROCEDURE:  09/17/2008  DATE OF DISCHARGE:                            CARDIAC CATHETERIZATION   PRIMARY CARE PHYSICIAN:  Valetta Mole. Swords, MD   CARDIOLOGIST:  Everardo Beals Juanda Chance, MD, [facility]   PROCEDURE:  Left heart catheterization/coronary arteriography.   INDICATION:  Evaluate the patient with chest pain suggestive of unstable  angina.  He had previous angioplasty of his LAD and right coronary  artery.   PROCEDURE NOTE:  Left heart catheterization performed via right femoral  artery.  The artery was cannulated using the anterior wall puncture.  A  #5 French arterial sheath was inserted via the modified Seldinger  technique.  Preformed Judkins and a pigtail catheter were utilized.  The  patient tolerated the procedure well and left the lab in stable  condition.   RESULTS:  Hemodynamics:  LV 172/3, AO 126/61.  Coronaries:  Left main had luminal irregularities.  The LAD had a long  proximal calcification.  There was long proximal 30-40% stenosis.  There  was focal 50% stenosis in the distal end of this proximal lesion.  First  diagonal was small with ostial 80% stenosis.  The second diagonal was  small with nonobstructive disease.  Circumflex in the AV groove had  ostial 25% stenosis.  There is a large mid obtuse marginal with a long  proximal 80% stenosis and a mid focal 95% stenosis.  The ramus  intermediate was a large with ostial 30% stenosis.  The right coronary  artery is dominant.  It was heavily calcified.  There was proximal 50%  and mid 50-60% stenosis.  There was 50% stenosis before the PDA.  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  Left ventriculogram:  The left ventriculogram was obtained in the RAO  projection.  The EF was 45% with anteroapical akinesis.   CONCLUSION:  Severe circumflex disease.  Nonobstructive disease  elsewhere.  Mildly reduced ejection fraction.   PLAN:  We will plan percutaneous revascularization of the marginal as  the most likely culprit.      Rollene Rotunda, MD, [facility]  Electronically Signed     JH/MEDQ  D:  09/17/2008  T:  09/18/2008  Job:  [id number]   cc:   Valetta Mole. Swords, MD

## 2010-08-17 NOTE — Discharge Summary (Signed)
[name], [name]                ACCOUNT NO.:  [id number]   MEDICAL RECORD NO.:  [id number]          PATIENT TYPE:  INP   LOCATION:  2014                         FACILITY:  [facility]   PHYSICIAN:  Everardo Beals. Juanda Chance, MD, FACCDATE OF BIRTH:  1919/04/28   DATE OF ADMISSION:  09/14/2008  DATE OF DISCHARGE:  09/23/2008                               DISCHARGE SUMMARY   PRIMARY CARDIOLOGIST:  Everardo Beals. Juanda Chance, MD, [facility]   PRIMARY CARE PHYSICIAN:  Valetta Mole. Swords, MD   PROCEDURES PERFORMED DURING HOSPITALIZATION:  1. Cardiac catheterization completed on September 17, 2008, by Dr. Rollene Rotunda revealing severe circumflex disease with ostial 80% with      mid focal 95%.  2. Status post percutaneous coronary intervention to the circumflex      artery per Dr. Verne Carrow, status post drug-eluting stent      to the second obtuse marginal artery with a 2.5 x 28 mm Xience drug-      eluting stent.   FINAL DISCHARGE DIAGNOSES:  1. Coronary artery disease.  A:  Status post anterior wall myocardial infarction, treated with  streptokinase and percutaneous transluminal coronary angioplasty of the  left anterior descending in 1987.  B:  Status post angioplasty to the right coronary artery in 1994.  C:  Myoview study in October 2008 with an ejection fraction of 44% and  no ischemia.  D:  Status post cardiac catheterization on September 17, 2008, revealing  severe circumflex disease.  E:  Status post drug-eluting stent to the second obtuse marginal artery  per Dr. Verne Carrow.  1. Right groin hematoma postprocedure.  2. Ischemic cardiomyopathy with an ejection fraction of 45%.  3. Peptic ulcer disease, status post gastrectomy in the past.  4. History of gout.  5. History of dyslipidemia.  6. History of left nephrectomy secondary to hypernephroma.  7. Chronic kidney disease.   HOSPITAL COURSE:  This is an 89-year male patient followed by Dr. Juanda Chance  with above-mentioned diagnoses, who  had substernal chest discomfort  prior to admission which he described as pressure and tightness at rest.  As a result of this, he presented to the emergency room and the pain  improved with nitro paste.  He described it as 9/10 on the pain scale.  The patient was admitted to rule out myocardial infarction.  His EKG  demonstrated inferior and anterior Q-waves with no new ischemic changes.  The patient states, however, that the pain was similar to the pain he  had prior to his initial myocardial infarction in 1987.   The patient was seen and examined by Dr. Dietrich Pates on admission and the  pain had almost gone away.  His troponins were found to be negative x2.  He was continued on heparin and his Coumadin was placed on hold for  planned cardiac catheterization.  His INR remained elevated, therefore  he was given 0.5 mg of vitamin K p.o.  The patient's INR was checked the  following morning and was found to be within normal limits, therefore  the patient was scheduled  for a catheterization.  Prior to this, the  patient did have episode of paroxysmal atrial fibrillation and then  returned to normal sinus rhythm on its own.   The patient did undergo cardiac catheterization as described above on  September 17, 2008.  The patient was found to have other disease with a  heavily calcified 50% proximal lesion in the right coronary artery and  mid coronary artery.  The patient had an aneurysmal dilatation before  small PDA.  The patient had a large ostial 30% stenosis.  The LAD had a  long proximal calcification, 30-40% stenosis with focal 50% stenosis.  He had a D1 small ostial 80% and the D2 had a small amount of  nonobstructive disease.  It was reviewed by Dr. Antoine Poche who felt that  the patient would benefit from PCI of the circ, complex marginal.  The  patient subsequently did have a PCI completed with Dr. Verne Carrow the same day and which he placed a drug-eluting stent to the   circumflex artery reducing the stenosis.  The patient was continued on  heparin and aspirin, and Plavix was started during hospitalization post  procedure.   After the procedure that evening, the patient was found to have a large  right groin hematoma.  The patient was held pressure and applied  FemoStop stop.  Blood pressure remained stable.  The patient was seen by  Vascular Surgery to evaluate right groin hematoma post cath.  At the  time, the patient would did not need to have any hematoma evacuation or  intervention.  It was advised that the patient's heparin be held until  the following morning and that it should resolve in time.   The patient's hematoma was followed very closely through the remainder  of the hospitalization for recurrence of bleeding or worsening of the  hematoma.  His PT/INR was checked daily by Pharmacy and the patient  remained stable.  The patient was restarted on Coumadin prior to  discharge and also continued on heparin.  There was no evidence of  further bleeding.  The patient was monitored with PT/INR per Pharmacy  with the heparin bridging.   A couple of days later, the patient did have some recurrent bleeding of  the right groin side and heparin was discontinued.  His Coumadin was  continued along with Plavix and aspirin.  He was worked with Cardiac  Rehab to move and ambulate, so that he would regain his strength.  The  patient did get up and ambulate without any difficulty.  He did not have  any recurrence of chest pain or recurrent bleeding to the hematoma.  He  was also followed by PT during hospitalization and responded well to  supervised ambulation.   On the day of discharge, the patient was seen and examined by Dr. Charlies Constable and found to be stable and his blood pressure was 106/68 and  heart rate 89.  The patient had no further complaints of chest pain and  there was no further evidence of bleeding or worsening of the right  groin  hematoma.  The patient was continued on his Coumadin, Plavix, and  aspirin and will follow with Dr. Juanda Chance as an outpatient with a  discussion to decide the need to continue Plavix as an outpatient after  1 month.  Coumadin will be continued as well and will be adjusted per  his primary care physician who monitors his PT/INR.  Dr. Juanda Chance will  make a  decision on followup appointment on the need to continue Plavix.   DISCHARGE LABORATORY DATA:  Hemoglobin 11.7.  BUN 24 and creatinine 1.3.  INR 2.2.  EKG on discharge, normal sinus rhythm with left axis  deviation, evidence of inferior septal Q-waves were noted.   DISCHARGE MEDICATIONS:  1. Simvastatin 40 mg at bedtime.  2. Ferrous sulfate 325 twice a day.  3. Ascorbic acid 1000 mg daily.  4. Metoprolol 12.5 mg twice a day (prescription provided).  5. Plavix 75 mg daily (prescription provided).  6. Coumadin as directed, currently going home on 5 mg daily.  He will      follow up with Dr. Cato Mulligan for PT/INR.  7. Aspirin 81 mg daily.  8. Oxycodone APAP p.r.n. discomfort.  9. Nitroglycerin 0.4 mg p.r.n. chest discomfort sublingually.   ALLERGIES:  CODEINE, MORPHINE, and COREG.   FOLLOWUP PLANS AND APPOINTMENT:  1. The patient will follow up with Dr. Charlies Constable on October 20, 2008,      at a previously scheduled appointment at 10:45 a.m.  2. The patient will follow up with Dr. Birdie Sons this week, the      patient will call to make an appointment and schedule PT/INR check.  3. The patient has been given continued instruction on evaluation of      the right groin sites for evidence of recurrence of bleeding or      hematoma.  4. The patient has been advised on new medications and followup      appointments.   Time spent with the patient to include physician time 40 minutes.      Bettey Mare. Lyman Bishop, NP      Everardo Beals. Juanda Chance, MD, [facility]  Electronically Signed    KML/MEDQ  D:  09/23/2008  T:  09/24/2008  Job:  [id number]   cc:    Valetta Mole. Swords, MD

## 2010-08-17 NOTE — Consult Note (Signed)
[name]                ACCOUNT NO.:  [id number]   MEDICAL RECORD NO.:  [id number]          PATIENT TYPE:  INP   LOCATION:  2506                         FACILITY:  [facility]   PHYSICIAN:  Quita Skye. Hart Rochester, M.D.  DATE OF BIRTH:  28-Dec-1919   DATE OF CONSULTATION:  09/17/2008  DATE OF DISCHARGE:                                 CONSULTATION   Consultation requested by Dr. Gala Romney.   CHIEF COMPLAINT:  Hematoma, right inguinal area, post cardiac  intervention.   HISTORY OF PRESENT ILLNESS:  This 77 year old male patient had a cardiac  cath earlier today by Dr. Clifton James and had a cardiac intervention  performed.  The sheath was pulled at approximately 8:30 p.m.  The  patient was noted to have increasing hematoma in the right inguinal  area.  CT scan was obtained, which was reviewed by me and reveals no  evidence of significant retroperitoneal hematoma.  He does have a  significant hematoma, which is dissected down into the medial thigh area  with not a large collection over the femoral artery.  The patient has  remained hemodynamically stable.  He was on Coumadin chronically, which  was discontinued 3 days prior to the procedure with an INR of 1.5.   PAST MEDICAL HISTORY:  1. Coronary artery disease with previous interventions in 1987 and      1994, and previous diagnosis of ischemic cardiomyopathy.  2. Hypertension.  3. History of peptic ulcer disease.  4. Gout.  5. Hyperlipidemia.  6. History of deep venous thrombosis with pulmonary embolus.   PHYSICAL EXAMINATION:  GENERAL:  The patient is alert and oriented x3.  VITAL SIGNS:  Blood pressure is 160/40, heart rate 90, respirations 14.  ABDOMEN:  Soft, nontender with no palpable masses.  EXTREMITIES:  Right leg to have 3+ femoral popliteal and 2+ posterior  tibial pulse.  Left leg has 3+ femoral popliteal and posterior tibial  pulse palpable.  There is diffuse hematoma in the right thigh adjacent  to the right inguinal  area with some ecchymosis in the skin.  This area  is not tense.  The inguinal area itself is being compressed and there is  no expansile pulsation.   IMPRESSION:  Hematoma, post cardiac intervention, right thigh.   RECOMMENDATIONS:  Would follow his hematocrit and vital signs closely.  I do not think this needs evacuation.  It is not tense and most of the  blood is dissected down into the thigh, which will be difficult to  evacuate.  I suspect this will resolve spontaneously over time.  Would  hold the heparin at least for another 10-12 hours to be certain that the  patient does not rebleed.      Quita Skye Hart Rochester, M.D.  Electronically Signed     JDL/MEDQ  D:  09/17/2008  T:  09/18/2008  Job:  [id number]

## 2010-08-17 NOTE — Assessment & Plan Note (Signed)
[name]                            CARDIOLOGY OFFICE NOTE   NAME:[name], [name]                       MRN:          [id number]  DATE:02/20/2007                            DOB:          1920-02-18    PRIMARY CARE PHYSICIAN:  Valetta Mole. Swords, M.D.   CLINICAL HISTORY:  [name] is 77 years old and was recently seen by  Tereso Newcomer with left-shoulder pain.  This was somewhat similar to his  previous pain, when he had his heart attack and Scott arranged for him  to have a rest stress adenosine Myoview scan.  This showed an  anteroseptal scar, but no significant ischemia.  His ejection fraction  was 34%.  His previous ejection fraction by echocardiography in 2006 was  estimated at 55%.   He says he has had no symptoms of chest pain, shortness of breath or  palpitations.  He did have an anterior myocardial infarction, treated  with streptokinase, in 1987, and subsequently angioplasty to LAD and  subsequently had stenting of the right coronary in 1994.   PAST MEDICAL HISTORY:  Significant for a previous deep vein  thrombophlebitis and pulmonary embolism in 2004.  He also has a history  of TIA in 2000.  He has hyperlipidemia and has had a previous  nephrectomy for, I believe, hypernephroma.   CURRENT MEDICATIONS INCLUDE:  Coumadin, simvastatin, allopurinol,  atenolol, amlodipine, and furosemide.  He had been on lisinopril, but  this was discontinued and I will have to review for the reasons for  this.   ON EXAMINATION:  The blood pressure was 121/73 and the pulse 67 and  regular.  There was no vein distention.  The carotid pulses were full, without  bruits.  CHEST:  Clear.  Cardiac rhythm was regular.  Actually no murmurs or gallops.  ABDOMEN:  Soft, without organomegaly.  Peripheral pulses were full and there was no peripheral edema.   IMPRESSION:  1. Recent shoulder pain with non-ischemic Myoview scan, now resolved.  2. Coronary artery  disease, status post remote anterior wall      myocardial infarction, treated with streptokinase and subsequent      PTCA of the LAD in 1987 with subsequent stenting of the right      coronary artery in 1994.  3. Left ventricular dysfunction by recent Myoview scan with ejection      fraction of 34%.  4. History of deep vein thrombophlebitis and pulmonary embolus in      2004, on Coumadin therapy.  5. History of TIA in 2000.  6. Hyperlipidemia with good control.  7. Hypertension with good control.  8. History of anemia with negative endoscopy and colonoscopy.   RECOMMENDATIONS:  I think [name] is doing well.  He does have worse  LV dysfunction with no ischemia on Myoview scan.  I do not think we need  to pursue this further.  I think he has been intolerant to ACE  inhibitors in the past and I will review this and we will decide if we  should try and get him on that  later.  I will see him back in followup  in a year.   ADDENDUM:  I cannot determine why the lisinopril was stopped in the  past.  He does have one kidney and his creatinine is 1.3 and it is  possible it is related to that.     Bruce Elvera Lennox Juanda Chance, MD, [facility]  Electronically Signed    BRB/MedQ  DD: 02/20/2007  DT: 02/20/2007  Job #: [id number]   cc:   Valetta Mole. Swords, MD

## 2010-08-20 NOTE — H&P (Signed)
NAMEBHAVIN, [name]                          ACCOUNT NO.:  [id number]   MEDICAL RECORD NO.:  [id number]                   PATIENT TYPE:  INP   LOCATION:  2041                                 FACILITY:  [facility]   PHYSICIAN:  Titus Dubin. Alwyn Ren, M.D. [facility]         DATE OF BIRTH:  February 25, 1920   DATE OF ADMISSION:  07/06/2002  DATE OF DISCHARGE:                                HISTORY & PHYSICAL   HISTORY OF PRESENT ILLNESS:  The patient is an 77 year old white male  admitted with deep venous thrombosis and pulmonary thromboemboli.   Significantly, he was hospitalized for the same illness, December 15, 2001,  and has been on Coumadin for the last six months.  He has been off Coumadin  for one month.   As of July 07, 2002, he began to note swelling and pain in the left leg.  Today, he has had some chest pain which he describes as very little.  It  is substernal without radiation and not associated with nausea or  diaphoresis.  It is nonexertional.   PAST MEDICAL HISTORY:  Past medical history includes a myocardial infarction  in 1987.  He was hospitalized with chest pain in 1998.  In 1942, he had an  appendectomy, in 1974, an ulcer.  In 1980, he had a partial gastrectomy.  He  has had a left nephrectomy and TURP.  He has had cataract surgery  bilaterally and laser surgery on the left.   Medical problems include hypertension.   HABITS:  He does not drink or smoke.   ALLERGIES:  MORPHINE SULFATE causes mental status changes and  hallucinations.   MEDICATIONS:  1. Bextra 20 mg daily.  2. Enteric-coated aspirin 325 mg daily.  3. Lisinopril 10 mg daily.  4. Atenolol 25 mg daily.  5. As stated, Coumadin was continued until one month ago.   FAMILY HISTORY:  Family history includes stroke and diabetes in his mother,  leukemia in a sister and cancer in a brother.   REVIEW OF SYSTEMS:  He denies any previous history of coagulopathy in a  perioperative setting.  He denies any shortness  of breath.  He has had no  abdominal pain, nausea, vomiting, melena or rectal bleeding.  He has no  genitourinary symptoms.  He sees Dr. Derrell Lolling. Sural every April.   Other than the leg swelling, he has had no musculoskeletal symptoms.   PHYSICAL EXAMINATION:  GENERAL:  At this time, he is lying on the gurney in  a comfortable state.  VITAL SIGNS:  Blood pressure is 132/84, temperature 96.1, pulse 73 and  respiratory rate 24.  O2 saturations are 98% on room air.  HEENT:  Arteriolar narrowing is noted.  Dental hygiene is excellent.  Otolaryngologic exam is unremarkable.  NODES:  He has no lymphadenopathy about the head, neck or axillae.  NECK:  No carotid bruits were noted.  Thyroid is normal to palpation.  CHEST:  Chest is clear to auscultation with no splinting.  CARDIAC:  He has an S4 with no murmurs.  ABDOMEN:  Abdomen is soft with an epigastric operative scar.  There are mild  ecchymotic lesions noted over the extremities.  EXTREMITIES:  The left lower extremity is obviously swollen with a positive  Homans.  There is some pitting edema on the left.  Pedal pulses are still  intact, despite the edema.   LABORATORY AND ACCESSORY CLINICAL DATA:  Radiographic studies reveal deep  venous thrombosis which is extensive on the left and questionably present on  the right as well.  He has positive pulmonary thromboemboli.   CBC and differential reveals a platelet count of 117,000.  INR is 1.2.  D-  dimer is 12.18 with normals less than 0.48.   ASSESSMENT AND PLAN:  He is hospitalized with recurrent deep venous  thrombosis and pulmonary thromboemboli.  It is to be noted that in  September, there was some question of trauma to the extremity; no such  history exists at this time.   Protein C, protein S, antithrombin III and Leiden factor V will be done  prior to initiating the heparin drip on a STAT basis.  A prostate-specific  antigen will also be drawn.  There is no apparent reason for  him to have  recurrent pulmonary thromboemboli and deep venous thrombosis, unless he has  a postphlebitic syndrome and the trauma in September was a significant  trigger.   He is now committed to life-long Coumadin.                                               Titus Dubin. Alwyn Ren, M.D. [facility]    WFH/MEDQ  D:  07/06/2002  T:  07/08/2002  Job:  [id number]   cc:   Valetta Mole. Swords, M.D. [facility]   Claudette Laws, M.D.  [address]  [place]  [place] [address]  Fax: [phone]

## 2010-08-20 NOTE — H&P (Signed)
[name], [name]                          ACCOUNT NO.:  [id number]   MEDICAL RECORD NO.:  [id number]                   PATIENT TYPE:  EMS   LOCATION:  MINO                                 FACILITY:  [facility]   PHYSICIAN:  Titus Dubin. Alwyn Ren, M.D. [facility]         DATE OF BIRTH:  03-Oct-1919   DATE OF ADMISSION:  12/15/2001  DATE OF DISCHARGE:                                HISTORY & PHYSICAL   HISTORY OF PRESENT ILLNESS:  The patient is an 77 year old white male who  has moved to [facility] from [facility] within the last two weeks. His  primary care physician is Dr. Marlou Starks of [facility]. He is now admitted  with pulmonary thromboembolism. He has seen Shawnie Pons, M.D.,  cardiologist, of [place] and Lina Sar, M.D., of [place], [facility], but has  never been seen by any [place] primary care physician.   He presents with intermittent left parascapular and left anterior chest pain  for the past two days. It is described as sharp, worse with cough, and  intermittent. He has history of trauma to the lower extremities and no  history of prolonged travel. There is no personal or family history of  clotting disorders.   PAST MEDICAL HISTORY:  Significantly in 1987, he had a myocardiac  infarction. He was hospitalized in 1998 in [facility] for chest pain. In  1942, he had an appendectomy. He had ulcer surgery in 1974 and a partial  gastrectomy in 1980. He had a nephrectomy in 1988 which proved to be a  benign lesion. His urologist is Dr. Darvin Neighbours. He has had bilateral cataract  surgery and has had laser surgery of the left eye. Medical problems  including hypertension and the aforementioned ulcer disease.   SOCIAL HISTORY:  He does not smoke or drink. He lives with his wife for  multiple decades.   ALLERGIES:  Morphine has caused mental status changes.   MEDICATIONS:  He is on Bextra 20 mg daily for arthralgias. He takes  prophylactic aspirin 325 mg daily, vitamin C 1000 mg daily,  vitamin E 400  International Units daily, atenolol 25 mg a day, and lisinopril 10 mg daily.   FAMILY HISTORY:  Family history includes stroke and diabetes in his mother,  leukemia in his sister.   REVIEW OF SYSTEMS:  Review of systems is negative except for the arthralgias  and frequency. He has had frequency for multiple decades. Other than the  chest pain, he has no other cardiopulmonary symptoms or abdominal symptoms.  Despite history of ulcer, he has no melena or rectal bleeding.   PHYSICAL EXAMINATION:  Rhythm is normal sinus on telemetry. Blood pressure  was variable from 143/81 to 166/92. Respiratory rate is 16; he does splint  some when he breathes lying on the gurney. He is in no acute distress; in  fact, he was admitted to the minor side of the ER prior to diagnosing  the  pulmonary thromboemboli. Only minimal arterial narrowing changes noted. His  tympanic membranes are normal despite his history of having hearing aids in  the past. Dental hygiene is good. Remainder of neurologic exam is  unremarkable. He has no lymphadenopathy of the head, neck, or axilla.  Thyroid is normal to palpation. He has no carotid bruits. A S4 is noted.  Abdomen is soft with no organomegaly or masses. He does have a midline  operative scar. There is no edema. The feet are cool, but the posterior  tibial pulses are good with only slight decrease in the dorsalis pedis  pulses. Homans sign is negative. He has slight abrasion of the left shin and  ecchymosis over the forearms. Rectal and genital exam was initially deferred  as it is not germane to reason for admission.   He has no localized neurologic signs although there is slight asymmetry in  the nasolabial folds. He is oriented x3 and quite interactive and  intelligent.   LABORATORY DATA:  Radiographic studies done show pulmonary thromboemboli.  His hematocrit is 43.9. Platelet count is 114,000. There are no chemistry or  electrolyte  abnormalities. Initial PT is 15.4 with an INR of 1.2. PTT is 33.  Chest x-ray revealed emphysema with small left pleural effusion with a  sclerotic projecting over the left second rib. A CT showed venous thrombosis  in addition to the pulmonary thromboemboli.   ASSESSMENT:  He is now admitted with pulmonary thromboemboli and deep venous  thrombosis for anticoagulation therapy. Because of his history of ulcers, we  did place him on a protein pump inhibitor and stools monitored for blood.  Hypertensive control will be pursued.                                               Titus Dubin. Alwyn Ren, M.D. [facility]    WFH/MEDQ  D:  12/15/2001  T:  12/17/2001  Job:  [id number]

## 2010-08-20 NOTE — Discharge Summary (Signed)
[name], [name]                          ACCOUNT NO.:  [id number]   MEDICAL RECORD NO.:  [id number]                   PATIENT TYPE:  INP   LOCATION:  3739                                 FACILITY:  [facility]   PHYSICIAN:  Rene Paci, M.D. [facility]          DATE OF BIRTH:  1920/03/18   DATE OF ADMISSION:  12/15/2001  DATE OF DISCHARGE:  12/19/2001                                 DISCHARGE SUMMARY   DISCHARGE DIAGNOSES:  1. Atypical chest pain.  2. Multiple pulmonary emboli.  3. Right lower extremity deep vein thrombosis.  4. Multinodular goiter.  5. Sclerotic bony lesions.   HISTORY OF PRESENT ILLNESS:  The patient is an 77 year old white male who  presented intermittent left parascapular left anterior chest pain for the  past 2 days.  Worse with cough.  He denies any history of trauma to the  lower extremities or prolonged travel.  CT chest revealed pulmonary  thromboembolus as well as right lower extremity DVT.   PAST MEDICAL HISTORY:  1. Coronary artery disease status post MI in 1987.  2. Status post appendectomy.  3. Peptic ulcer disease status post partial gastrectomy.  4. Status post nephrectomy in 1988 secondary to a benign lesion.  5. Status post bilateral cataract surgery.  6. Hypertension.   HOSPITAL COURSE:  1. PULMONARY EMBOLUS AND RIGHT LOWER EXTREMITY DEEP VEIN THROMBOSIS.  The     patient was admitted for heparin and Coumadin anticoagulation.  Once     therapeutic, the patient was stable for discharge.   1. ENDOCRINE.  His CT chest did reveal a probable multinodular goiter.  TSH     was normal.  A thyroid ultrasound was recommended.   1. ORTHOPEDICS.  Sclerotic bony lesions were noted on CT chest as well and     recommended a followup bone scan.   LABORATORIES AT DISCHARGE:  Prothrombin time was 21.2, INR 2.2, and a TSH  was 1.744.  CBC was normal.  BMET was normal.   MEDICATIONS AT DISCHARGE:  1. Coumadin to be determined at discharge.  2. Bextra 20  mg q.d.  3. Simvastatin 80 mg q.d.  4. Vitamin C and E as at home.  5. Atenolol 25 mg q.d.  6. Lisinopril 10 mg q.d.   The patient has been instructed not to take his aspirin at this time.   FOLLOW UP:  1. Follow up with Dr. Cato Mulligan on Tuesday, January 01, 2002 at 10:30 a.m.     At that time may want to consider a thyroid ultrasound as well as a bone     scan.  2. Follow up with Dr. Cato Mulligan' office on Friday, December 21, 2001 between 2     and 4 p.m. for prothrombin time.     Cornell Barman, P.A. [facility]                  Rene Paci, M.D. [facility]  LC/MEDQ  D:  12/19/2001  T:  12/21/2001  Job:  [id number]   cc:   Valetta Mole. Swords, M.D. [facility]

## 2010-08-20 NOTE — Discharge Summary (Signed)
NAME:  [name], [name]                          ACCOUNT NO.:  [id number]   MEDICAL RECORD NO.:  [id number]                   PATIENT TYPE:  INP   LOCATION:  5150                                 FACILITY:  [facility]   PHYSICIAN:  Rosalyn Gess. Norins, M.D. 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   HISTORY OF PRESENT ILLNESS:  The patient is an 77 year old gentleman with  deep venous thrombosis and pulmonary emboli.  He was hospitalized for a  similar problem in September of 2003 and had been on Coumadin for the last  six months, but had been off Coumadin for one month.  On July 07, 2002, he  noted swelling and pain in his left leg and then presented to the hospital  with some chest pain.  He was found at admission to have a DVT and a  positive pulmonary emboli by CT scan.   Please see the H&P for past medical history, family history, social history,  and examination.   HOSPITAL COURSE:  #1 - DEEP VENOUS THROMBOSIS:  The patient was admitted to  a regular bed.  He was started on IV heparin and anticoagulation with  Coumadin.  The patient's laboratory work-up included a factor V Leiden which  was normal.  The protein S was functionally low at 72 and the protein C was  normal at 107.  The patient did well in the hospital with resolution of  shortness of breath with no recurrent chest pain.  He was gradually adjusted  on his Coumadin so that on the day of discharge his INR was 2.4 and  therapeutic.  With the patient's INR being in the therapeutic range, he was  thought to be stable and ready for discharge home.   #2 - HYPOTENSION:  The patient's blood pressure had been somewhat labile.  Atenolol had been  increased during his hospital stay.  The patient's blood  pressure remained stable.  On the day of discharge, his blood pressure last  recorded was 136/80.  The patient will be discharged home on adjusted  medications.   DISCHARGE PHYSICAL EXAMINATION:  VITAL SIGNS:  Temperature 97 degrees, blood  pressure 136/80, pulse 62, respirations 21, O2 saturation on room air 98%.  GENERAL APPEARANCE:  This is a pleasant gentleman in no acute distress.  He  is up and walking in the halls, therefore no further examination was  conducted.   FINAL STUDIES:  CT scan from July 06, 2002, revealed bilateral pulmonary  emboli similar in degree to prior CT scan from December 17, 2001.  The  patient was noted to have an extensive DVT of the left leg.  No  other  radiographic studies.   FINAL LABORATORY DATA:  The INR on the day of discharge was 2.4.  The final  CBC from July 12, 2002, had a white count of 9100, hemoglobin 15.5, and  platelet count 170,000.  Coagulation factors as noted.  Chemistries on the  day of admission were unremarkable with a sodium of 137, potassium 4.9, and  creatinine 1.3.  The patient had a PSA during the hospital stay of 2.95.   DISCHARGE MEDICATIONS:  The patient will be continued on:  1. Bextra 20 mg daily.  2. Lisinopril 10 mg daily.  3. Enteric-coated aspirin one daily.  4. Atenolol 15 mg a day which is an increase from admission.  5. Coumadin 7.5 mg daily.   DISPOSITION:  The patient is discharged home.  He is to report to the  [place] office on Monday or Tuesday for a follow-up pro time and  appropriate adjustment in his Coumadin dosing.   CONDITION ON DISCHARGE:  The patient's condition at the time of discharge  dictation is stable and improved.                                               Rosalyn Gess Norins, M.D. [facility]    MEN/MEDQ  D:  07/13/2002  T:  07/14/2002  Job:  [id number]   cc:   Valetta Mole. Swords, M.D. [facility]

## 2010-08-20 NOTE — Discharge Summary (Signed)
[name], [name]                          ACCOUNT NO.:  [id number]   MEDICAL RECORD NO.:  [id number]                   PATIENT TYPE:  INP   LOCATION:  3013                                 FACILITY:  [facility]   PHYSICIAN:  Danae Orleans. Venetia Maxon, M.D.               DATE OF BIRTH:  May 21, 1919   DATE OF ADMISSION:  09/11/2003  DATE OF DISCHARGE:  09/19/2003                                 DISCHARGE SUMMARY   REASON FOR ADMISSION:  1. Lumbar disk herniation.  2. Lumbar spondylosis.  3. Lumbosacral degenerative disk disease.   ADDITIONAL DIAGNOSES:  1. Unspecified constipation.  2. Coronary artery atherosclerosis of a native coronary vessel.  3. Old myocardial infarction.  4. Hypertension, not otherwise specified.  5. Long term use of anticoagulants.  6. History of deep venous thrombosis and pulmonary embolism.   FINAL DIAGNOSES:  1. Lumbar disk herniation.  2. Lumbar spondylosis.  3. Lumbosacral degenerative disk disease.  4. Unspecified constipation.  5. Coronary artery atherosclerosis of a native coronary vessel.  6. Old myocardial infarction.  7. Hypertension, not otherwise specified.  8. Long term use of anticoagulants.  9. History of deep venous thrombosis and pulmonary embolism.   HISTORY OF ILLNESS AND HOSPITAL COURSE:  [name] is an 77 year old man  for whom I have been caring for right L2 lumbar radiculopathy from the far  lateral osteophyte and disk space collapse at the L2-L3 level.  He has had  multiple injections in an effort to give him some pain relief without a  great deal sustained improvement.  He had a deep vein thrombosis and  pulmonary embolism for which he has been on long term anticoagulation with  Coumadin.  Eventually, he decided the pain was so bad that he was willing to  take the chance of surgery and coming off the Coumadin and the possibility  of having further pulmonary embolus.  The patient was, therefore, taken to  surgery, on September 11, 2003, at  which time he underwent bilateral L2-L3  laminectomies, transverse lumbar interbody fusion with interbody graft,  along with autograft and pedicle screw fixation L2-L3 bilaterally with  posterior lateral arthrodesis and with autograft and __________  .  Postoperatively, the patient had some incisional pain but was otherwise  doing well.  He was treated with Lovenox and then gradually restarted on  Coumadin on September 17, 2003.  He was brought to a therapeutic level on the  Coumadin and was continued with Lovenox 80 mg q.12h. and was doing better on  the 15th.  He was then discharged home as of the 17th with significant  continued improvement.   DISCHARGE MEDICATIONS:  1. Coumadin.  2. Vicodin 1-2 every four hours as needed.  3. Valium 5 mg __________  every six hours as needed for muscular spasm.   He will follow up with Dr. Cato Mulligan for Coumadin therapy and also with Dr.  Venetia Maxon  in three weeks with x-rays of lumbar spine.                                                Danae Orleans. Venetia Maxon, M.D.    JDS/MEDQ  D:  10/22/2003  T:  10/23/2003  Job:  [id number]

## 2010-08-20 NOTE — Assessment & Plan Note (Signed)
[facility]                            CARDIOLOGY OFFICE NOTE   NAME:Greenwell, MARSHELL DILAURO                       MRN:          [id number]  DATE:06/26/2006                            DOB:          [birth year]    PRIMARY CARE PHYSICIAN:  Valetta Mole. Swords, M.D.   CLINICAL HISTORY:  Ketih Goodie is 77 years old and returns for followup  management of his coronary heart disease.  He had an anterior MI treated  with streptokinase in 1987 and subsequently had PTCA of that vessel.  In  1994, he had stenting of the right coronary artery.  He has done quite  well since that time.  He has had no recent symptoms of chest pain,  shortness of breath, or palpitations.   PAST MEDICAL HISTORY:  1. Nephrectomy in 1988.  2. Mini-stroke in 2000.  3. History of deep venous thrombosis and pulmonary embolism in 2004      with no obvious cause and has been on Coumadin since that time.  4. History of hyperlipidemia.  5. The patient recently was found to be anemic and underwent      evaluation by Dr. Leone Payor with colonoscopy and endoscopy, both of      which were normal.   CURRENT MEDICATIONS:  Coumadin, simvastatin, lisinopril, allopurinol,  atenolol, and aspirin.   PHYSICAL EXAMINATION:  VITAL SIGNS:  Blood pressure 117/73, pulse 53 and  regular.  NECK:  No vein distension.  The carotid pulses were full without bruits.  CHEST:  Clear.  CARDIAC:  Rhythm was regular.  He had no murmurs or gallops.  ABDOMEN:  Soft with normal bowel sounds.  There was a surgical scar.  EXTREMITIES:  Peripheral pulses were full.  There was no peripheral  edema.   An ECG showed old anterior wall infarction that had not changed.   IMPRESSION:  1. Coronary artery disease status post anterior wall myocardial      infarction treated with streptokinase and subsequent percutaneous      transluminal coronary angioplasty of the left anterior descending      in 1987 and subsequent stenting of the right  coronary artery in      1994, now stable.  2. Good left ventricular function.  3. Hyperlipidemia.  4. Status post left nephrectomy.  5. History of mini-stroke in 2000.  6. History of deep venous thrombosis and pulmonary embolism in 2004 on      chronic Coumadin therapy.  7. Anemia.   RECOMMENDATIONS:  I think Mr. Defranco is doing quite well from a cardiac  standpoint.  His recent lipid profile was good, except for a borderline  low HDL.  His blood pressure is well-controlled.  We will not plan to  make any changes, and I will see him back in followup in a year.     Bruce Elvera Lennox Juanda Chance, MD, [facility]  Electronically Signed    BRB/MedQ  DD: 06/26/2006  DT: 06/26/2006  Job #: [id number]

## 2010-08-20 NOTE — Discharge Summary (Signed)
   NAME:  [name], [name]                          ACCOUNT NO.:  [id number]   MEDICAL RECORD NO.:  [id number]                   PATIENT TYPE:  INP   LOCATION:  5150                                 FACILITY:  [facility]   PHYSICIAN:  Rosalyn Gess. Norins, M.D. [facility]         DATE OF BIRTH:  [birth year]   DATE OF ADMISSION:  07/06/2002  DATE OF DISCHARGE:  07/13/2002                                 DISCHARGE SUMMARY   DISCHARGE SUMMARY:   ADDENDUM:  Patient does have history of peptic ulcer disease and status post  vagotomy and partial gastrectomy.  He is on Bextra for a significant  degenerative disk disease and back pain.  The patient is also on Coumadin,  as noted above.   PLAN:  Patient is to be taking Prevacid 30 mg p.o. q.d. or  Prilosec OTC  once daily.  Prescription for Prevacid is provided at discharge.                                               Rosalyn Gess Norins, M.D. [facility]    MEN/MEDQ  D:  07/13/2002  T:  07/14/2002  Job:  [id number]

## 2010-08-20 NOTE — Op Note (Signed)
NAMEBRISTOL, [name]                          ACCOUNT NO.:  [id number]   MEDICAL RECORD NO.:  [id number]                   PATIENT TYPE:  INP   LOCATION:  2550                                 FACILITY:  [facility]   PHYSICIAN:  Danae Orleans. Venetia Maxon, M.D.               DATE OF BIRTH:  Aug 16, 1919   DATE OF PROCEDURE:  09/11/2003  DATE OF DISCHARGE:                                 OPERATIVE REPORT   PREOPERATIVE DIAGNOSIS:  L2-3 disk herniation with spondylosis, degenerative  disk disease and radiculopathy.   POSTOPERATIVE DIAGNOSIS:  L2-3 disk herniation with spondylosis,  degenerative disk disease and radiculopathy.   OPERATION PERFORMED:  Bilateral L2-3 laminotomies, transverse lumbar  interbody fusion with TLIF Peak interbody cage with morcelized bone  autograft and pedicle screw fixation, L2 through L3 bilaterally with  Alphatek Zodiac pedicle screw fixation with bilateral posterolateral  arthrodesis with morcelized autograft and Healos sponge.   SURGEON:  Danae Orleans. Venetia Maxon, M.D.   ASSISTANT:  Hilda Lias, M.D.   ANESTHESIA:  General endotracheal.   ESTIMATED BLOOD LOSS:  1300 mL with 600 mL of Cell Saver blood returned to  the patient.   COMPLICATIONS:  None.   DISPOSITION:  Recovery.   INDICATIONS FOR PROCEDURE:  [name] is an 77 year old man with severe  bilateral lower extremity pain with marked spondylosis, L2-3 causing  foraminal and extraforaminal compression of the L2 nerve roots bilaterally.  It was elected to take him to surgery after he did not improve with  conservative measures including injections to decompress these nerves and  stabilize his spine at this level.   DESCRIPTION OF PROCEDURE:  [name] was brought to the operating room.  Following the satisfactory and uncomplicated induction of general  endotracheal anesthesia and placement of intravenous lines and Foley  catheter, the patient was placed in a prone position on the operating table.  The low  back was then prepped and draped in the usual sterile fashion.  The  area of planned incision was infiltrated with 0.25% Marcaine, 0.5%  lidocaine, 1:200,000 epinephrine.  An incision was made overlying the L2-3  level through lumbodorsal fascia bilaterally exposing what was felt to be  the L2-3 level.  This in fact turned out to be the L1-2 level on  intraoperative x-ray and exposure was moved down inferiorly one level.  The  transverse processes of L2 and L3 were then exposed bilaterally and a second  x-ray was obtained which confirmed correct level.  Hemilaminectomies of L2  were then performed with decompression of the thecal sac, lateral recesses,  L2 nerve roots and L3 nerve roots bilaterally.  There was a significant  amount of epidural bleeding which was controlled with bipolar electrocautery  and Gelfoam soaked in Thrombin.  Both L2 nerve roots were decompressed  widely as they extended up the neural foramina.  There was marked  spondylosis particularly on the right which was worse than  on the left.  Lamina spreader was placed on the left and on the right, a diskectomy was  performed with resultant significant decompression of the interspace.  The  bone was fairly soft and initially, it was elected to place a 10 mm TLIF  cage but it was felt that this was impacting on the end plates excessively  and consequently an 8 mm graft was placed, tamped into position, countersunk  appropriately.  Its positioning was confirmed on lateral fluoroscopy.  Morcelized bone autograft was then placed overlying this.  The implant was  placed after vigorous preparation of the end plates with a variety of curets  and ring curets.  The L2 nerve roots exited freely at this point and 5.5 mm  pedicle screws were placed at L2 and L3 bilaterally.  45 mm screws at L2 and  40 mm screws at L3.  All screws had excellent purchase.  No cutouts were  appreciated.  AP and lateral fluoroscopy confirmed appropriate  trajectory of  these screws.  Subsequently 40 mm rods were placed overlying these screws  and locked in situ.  Healos sponge was soaked with blood aspirated from the  pedicles and morcelized bone autograft was placed overlying this and this  was placed overlying this and this was placed over decorticated transverse  processes of L2 and L3 on the right.  On the left side, morcelized bone  autograft with morcelized autograft drillings was then placed over the  decorticated transverse processes of L2 and L3 on the left.  Prior to  placing bone graft, the wound was copiously irrigated with bacitracin  saline.  The self-retaining retractor was removed.  The lumbodorsal fascia  was closed with 1 Vicryl suture, subcutaneous tissues reapproximated with 2-  0 Vicryl interrupted inverted sutures and skin edges were reapproximated  with interrupted 3-0 Vicryl subcuticular stitch.  The wound was dressed with  benzoin and Steri-Strips, Telfa gauze and tape.  The patient was extubated  in the operating room and taken to the recovery room in stable and  satisfactory condition having tolerated the operation well.  Counts were  correct at the end of the case.                                               Danae Orleans. Venetia Maxon, M.D.    JDS/MEDQ  D:  09/11/2003  T:  09/12/2003  Job:  [id number]

## 2010-08-20 NOTE — Assessment & Plan Note (Signed)
[facility]                         GASTROENTEROLOGY OFFICE NOTE   NAME:[name], [name]                       MRN:          [id number]  DATE:05/17/2006                            DOB:          1919/05/05    This is a patient of Dr. Birdie Sons.   Dr. Cato Mulligan asked me to evaluate [name] in consultation regarding  anemia.   HISTORY OF PRESENT ILLNESS:  [name] is a very pleasant 77 year old  man who was recently found to have normocytic anemia on basic lab  testing by Dr. Cato Mulligan.  His hemoglobin was 10.9, MCV 86.  His complete  metabolic profile was essentially normal, except for a BUN of 25.  He  has no signs of overt GI bleeding, specifically no hematochezia,  hematemesis or melena.  He moves his bowels very regularly once every  day or two.  He does have a complex gastric history.  He had what sounds  like gastric or duodenal ulcers in the 1970s.  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  His last surgery was in the mid-70s.  He had a  colonoscopy  also in the mid-70s and believes that was normal.  He has  had no endoscopy or colonoscopy since then.   REVIEW OF SYSTEMS:  Is notable for approximately 15-20 pound weight  loss, unintentional in the past year.  The rest of his review of systems  are essentially normal and is available on his nursing intake sheet.   PAST MEDICAL HISTORY:  Coronary artery disease with a angioplasty in the  1980s.  No trouble since then.  His left kidney was removed in the 80s  as well.  This ended up being a benign tumor.  Gastric surgeries as  above in the 1970s, appendectomy in 1942.  DVT to pulmonary embolus 4  years ago.  He has been on chronic Coumadin since then.  He did hold his  Coumadin without trouble for some dental procedures in the past couple  of years.   CURRENT MEDICATIONS:  Coumadin, vitamin C, Simvastatin, atenolol,  lisinopril, Allopurinol, aspirin.   ALLERGIES:  Intolerance to morphine.   SOCIAL HISTORY:  Nonsmoker, nondrinker, married.   FAMILY HISTORY:  His brother had lung cancer.  His sister had leukemia.  No colon cancer or colon polyps in the family.   PHYSICAL EXAMINATION:  VITAL SIGNS:  167 pounds, blood pressure 108/68,  pulse 76.  CONSTITUTIONAL:  Generally well-appearing elderly man.  NEUROLOGIC:  Alert and oriented x3.  EYES:  Extraocular movements intact.  MOUTH:  Oropharynx moist, no lesions.  NECK:  Supple, no lymphadenopathy.  CARDIOVASCULAR:  Heart regular rate and rhythm.  LUNGS:  Clear to auscultation bilaterally.  ABDOMEN:  Soft, nontender, nondistended.  Normal bowel sounds.  EXTREMITIES:  No lower extremity edema.  SKIN:  No rashes or lesions noted in his lower extremities.   ASSESSMENT/PLAN:  An 77 year old man with recently diagnosed normocytic  anemia, routine risk for colorectal cancer, elevated risk for gastric  cancer, given his history of gastric surgery.   We should proceed with full colonoscopy, as well as upper endoscopy, to  examine his anemia.  He has also been losing weight unintentionally.  I  see no reason for any further blood tests or imaging studies prior to  this.  He will hold his Coumadin for 5 days prior to the procedures.  He  has done this in the past for dental procedures without any events, but  he does know that there is a slightly increased risk of blood clots  while he is holding the Coumadin.     Rachael Fee, MD  Electronically Signed    DPJ/MedQ  DD: 05/17/2006  DT: 05/17/2006  Job #: [id number]   cc:   Valetta Mole. Swords, MD

## 2010-08-31 ENCOUNTER — Other Ambulatory Visit: Payer: Medicare Other

## 2010-08-31 DIAGNOSIS — I2699 Other pulmonary embolism without acute cor pulmonale: Secondary | ICD-10-CM

## 2010-08-31 DIAGNOSIS — I82409 Acute embolism and thrombosis of unspecified deep veins of unspecified lower extremity: Secondary | ICD-10-CM

## 2010-08-31 NOTE — Patient Instructions (Signed)
5 mg everydays

## 2010-09-07 ENCOUNTER — Ambulatory Visit (INDEPENDENT_AMBULATORY_CARE_PROVIDER_SITE_OTHER): Payer: Medicare Other | Admitting: Internal Medicine

## 2010-09-07 ENCOUNTER — Other Ambulatory Visit: Payer: Self-pay | Admitting: Internal Medicine

## 2010-09-07 DIAGNOSIS — I2699 Other pulmonary embolism without acute cor pulmonale: Secondary | ICD-10-CM

## 2010-09-07 DIAGNOSIS — I82409 Acute embolism and thrombosis of unspecified deep veins of unspecified lower extremity: Secondary | ICD-10-CM

## 2010-09-07 LAB — POCT INR: INR: 1.8

## 2010-09-07 MED ORDER — WARFARIN SODIUM 5 MG PO TABS: 5.0000 mg | ORAL_TABLET | ORAL | Status: DC

## 2010-09-07 NOTE — Patient Instructions (Signed)
7.5mg  Mon. And Thurs. All other days of the week take 5mg  Recheck in 10 days

## 2010-09-17 ENCOUNTER — Ambulatory Visit: Payer: Medicare Other | Admitting: Internal Medicine

## 2010-09-17 DIAGNOSIS — I82409 Acute embolism and thrombosis of unspecified deep veins of unspecified lower extremity: Secondary | ICD-10-CM

## 2010-09-17 DIAGNOSIS — I2699 Other pulmonary embolism without acute cor pulmonale: Secondary | ICD-10-CM

## 2010-09-17 LAB — POCT INR: INR: 2.9

## 2010-09-17 NOTE — Patient Instructions (Signed)
7.5 mg on mondays and thursdays 5 mg on other days,check in 4 weeks

## 2010-10-18 ENCOUNTER — Ambulatory Visit: Payer: Medicare Other

## 2010-10-18 DIAGNOSIS — I2699 Other pulmonary embolism without acute cor pulmonale: Secondary | ICD-10-CM

## 2010-10-18 DIAGNOSIS — I82409 Acute embolism and thrombosis of unspecified deep veins of unspecified lower extremity: Secondary | ICD-10-CM

## 2010-10-18 LAB — POCT INR: INR: 2.4

## 2010-10-18 NOTE — Patient Instructions (Addendum)
7.5 mg on mondays and thursdays 5 mg on other days,check in 4 weeks

## 2010-11-01 ENCOUNTER — Ambulatory Visit: Payer: PRIVATE HEALTH INSURANCE | Admitting: Internal Medicine

## 2010-11-18 ENCOUNTER — Ambulatory Visit: Payer: Medicare Other | Admitting: Internal Medicine

## 2010-11-18 DIAGNOSIS — I82409 Acute embolism and thrombosis of unspecified deep veins of unspecified lower extremity: Secondary | ICD-10-CM

## 2010-11-18 DIAGNOSIS — I2699 Other pulmonary embolism without acute cor pulmonale: Secondary | ICD-10-CM

## 2010-11-18 NOTE — Patient Instructions (Signed)
7.5 mg on mondays,wednesdays and fridays 5 mg on other days,check in 2 weeks

## 2010-11-29 ENCOUNTER — Ambulatory Visit: Payer: PRIVATE HEALTH INSURANCE | Admitting: Internal Medicine

## 2010-12-09 ENCOUNTER — Ambulatory Visit (INDEPENDENT_AMBULATORY_CARE_PROVIDER_SITE_OTHER): Payer: Medicare Other | Admitting: Internal Medicine

## 2010-12-09 ENCOUNTER — Encounter: Payer: Self-pay | Admitting: Internal Medicine

## 2010-12-09 DIAGNOSIS — I10 Essential (primary) hypertension: Secondary | ICD-10-CM

## 2010-12-09 DIAGNOSIS — I2699 Other pulmonary embolism without acute cor pulmonale: Secondary | ICD-10-CM

## 2010-12-09 DIAGNOSIS — I82409 Acute embolism and thrombosis of unspecified deep veins of unspecified lower extremity: Secondary | ICD-10-CM

## 2010-12-09 DIAGNOSIS — I2510 Atherosclerotic heart disease of native coronary artery without angina pectoris: Secondary | ICD-10-CM

## 2010-12-09 DIAGNOSIS — N259 Disorder resulting from impaired renal tubular function, unspecified: Secondary | ICD-10-CM

## 2010-12-09 LAB — POCT INR: INR: 3.6

## 2010-12-09 MED ORDER — SIMVASTATIN 80 MG PO TABS: ORAL_TABLET | ORAL | Status: DC

## 2010-12-09 NOTE — Assessment & Plan Note (Signed)
Controlled Continue same meds 

## 2010-12-09 NOTE — Assessment & Plan Note (Signed)
No sxs Continue risk factor modification 

## 2010-12-09 NOTE — Patient Instructions (Signed)
  Latest dosing instructions   Total Sun Mon Tue Wed Thu Fri Sat   40 5 mg 7.5 mg 5 mg 5 mg 5 mg 7.5 mg 5 mg    (5 mg1) (5 mg1.5) (5 mg1) (5 mg1) (5 mg1) (5 mg1.5) (5 mg1)

## 2010-12-09 NOTE — Assessment & Plan Note (Signed)
Has had recurrent DVT/PE Will need life-long warfarin

## 2010-12-09 NOTE — Progress Notes (Signed)
  Subjective:    Patient ID: [name], male    DOB: December 01, 1919, 77 y.o.   MRN: [id number]  HPI CAD---no sxs Pt with chronic back pain and abnormal gait---chronic. He did have one fall one month ago---no recurrence. He is using electric wheelchair at times  DVT---chronic warfarin therapy  Past Medical History  Diagnosis Date  . Myocardial infarction   . Peptic ulcer disease   . Hyperlipidemia   . Hypertension   . Hx pulmonary embolism   . Anemia   . CHF (congestive heart failure)   . Deep vein thrombosis (DVT)   . Stroke   . Hypotension     reaction to coreg   Past Surgical History  Procedure Date  . Appendectomy   . Cataract extraction   . Gastrectomy   . Nephrectomy     benign kidney tumor  . Transluminal angioplasty     reports that he has never smoked. He does not have any smokeless tobacco history on file. He reports that he does not drink alcohol or use illicit drugs. family history includes Cirrhosis in his son; Diabetes in his mother; Leukemia in his sister; and Stroke in his mother. Allergies  Allergen Reactions  . Morphine Sulfate     REACTION: unspecified    Review of Systems  patient denies chest pain, shortness of breath, orthopnea. Denies lower extremity edema, abdominal pain, change in appetite, change in bowel movements. Patient denies rashes, musculoskeletal complaints. No other specific complaints in a complete review of systems.      Objective:   Physical Exam  well-developed well-nourished , elderly male in no acute distress. HEENT exam atraumatic, normocephalic, neck supple without jugular venous distention. Chest clear to auscultation cardiac exam S1-S2 are regular. Abdominal exam overweight with bowel sounds, soft and nontender. Extremities no edema. Neurologic exam ihe is alert, using rolling walker.        Assessment & Plan:

## 2010-12-09 NOTE — Assessment & Plan Note (Signed)
Lab Results  Component Value Date   CREATININE 1.39 04/10/2010   No need for followup today

## 2010-12-23 ENCOUNTER — Ambulatory Visit (INDEPENDENT_AMBULATORY_CARE_PROVIDER_SITE_OTHER): Payer: Medicare Other | Admitting: Internal Medicine

## 2010-12-23 DIAGNOSIS — I82409 Acute embolism and thrombosis of unspecified deep veins of unspecified lower extremity: Secondary | ICD-10-CM

## 2010-12-23 DIAGNOSIS — I2699 Other pulmonary embolism without acute cor pulmonale: Secondary | ICD-10-CM

## 2010-12-23 LAB — POCT INR: INR: 1.8

## 2010-12-23 NOTE — Patient Instructions (Signed)
COUMADIN: Same dose, 7.5 mg on mondays and fridays 5 mg on other days,check in 2 weeks

## 2010-12-29 ENCOUNTER — Ambulatory Visit (INDEPENDENT_AMBULATORY_CARE_PROVIDER_SITE_OTHER): Payer: Medicare Other | Admitting: Cardiovascular Disease

## 2010-12-29 ENCOUNTER — Encounter: Payer: Self-pay | Admitting: Cardiovascular Disease

## 2010-12-29 VITALS — BP 127/81 | HR 96 | Ht 68.0 in | Wt 168.4 lb

## 2010-12-29 DIAGNOSIS — I82409 Acute embolism and thrombosis of unspecified deep veins of unspecified lower extremity: Secondary | ICD-10-CM

## 2010-12-29 DIAGNOSIS — I2510 Atherosclerotic heart disease of native coronary artery without angina pectoris: Secondary | ICD-10-CM

## 2010-12-29 LAB — CBC
HCT: 37 — ABNORMAL LOW
MCV: 106.9 — ABNORMAL HIGH
Platelets: 116 — ABNORMAL LOW
RDW: 15.9 — ABNORMAL HIGH
WBC: 4.9

## 2010-12-29 LAB — DIFFERENTIAL
Basophils Absolute: 0.1
Basophils Relative: 1
Eosinophils Absolute: 0.1
Eosinophils Relative: 3
Lymphs Abs: 1.2
Neutrophils Relative %: 63

## 2010-12-29 LAB — BASIC METABOLIC PANEL
BUN: 39 — ABNORMAL HIGH
Chloride: 105
Creatinine, Ser: 1.72 — ABNORMAL HIGH
Glucose, Bld: 99

## 2010-12-29 LAB — PROTIME-INR
INR: 2.2 — ABNORMAL HIGH
Prothrombin Time: 25.5 — ABNORMAL HIGH

## 2010-12-29 NOTE — Assessment & Plan Note (Signed)
Continue life long coumadin with h/o DVT and PE.

## 2010-12-29 NOTE — Progress Notes (Signed)
History of Present Illness:77 yo WM with history of CAD s/p multiple coronary interventions, HTN, HLD, anemia, DVT, PE here today for cardiac follow up. He had been followed in the past by DR. Brodie.  In 1987 he had an anterior MI treated with TPA followed by PTCA of the LAD. In 1995 he had a stent to the RCA. In June of 2010 he  had a drug eluting stent to the circumflex artery. His last echocardiogram in March of 2009 showed ejection fraction of 45%. He was admitted in June 2011  with DVT and PE. He was hospitalized January 2012 with chest pain and ruled out for an MI with serial cardiac enzymes. His EKG was unchanged. He was discharged home without ischemic workup.   He is here today for follow up. He has had no recurrence of his chest pain. His family (son in law is Sheldon Silvan of Anesthesia and daughter is radiologist) described  Weakness at his last visit and we asked him not to drive anymore. He lives in [place] with his wife. He has been on coumadin because of his thromboembolic disease and has done well without bleeding problems. He has been intolerant to Coreg in the past. Overall feeling well. His breathing has been ok.    Past Medical History  Diagnosis Date  . Myocardial infarction   . Peptic ulcer disease   . Hyperlipidemia   . Hypertension   . Hx pulmonary embolism   . Anemia   . CHF (congestive heart failure)   . Deep vein thrombosis (DVT)   . Stroke   . Hypotension     reaction to coreg    Past Surgical History  Procedure Date  . Appendectomy   . Cataract extraction   . Gastrectomy   . Nephrectomy     benign kidney tumor  . Transluminal angioplasty     Current Outpatient Prescriptions  Medication Sig Dispense Refill  . acetaminophen (TYLENOL) 650 MG CR tablet Take 650 mg by mouth every 8 (eight) hours as needed.        Marland Kitchen allopurinol (ZYLOPRIM) 300 MG tablet Take 300 mg by mouth daily.        . Ascorbic Acid (VITAMIN C) 1000 MG tablet Take 1,000 mg by mouth  daily.        Marland Kitchen aspirin 81 MG tablet Take 81 mg by mouth daily.        Marland Kitchen HYDROcodone-acetaminophen (VICODIN) 2.5-500 MG per tablet Take 1 tablet by mouth every 6 (six) hours as needed.        Marland Kitchen levofloxacin (LEVAQUIN) 250 MG tablet daily.      . metoprolol tartrate (LOPRESSOR) 25 MG tablet Take 1/2 tablet by mouth 2 times per day.  60 tablet  11  . nitroGLYCERIN (NITROSTAT) 0.4 MG SL tablet Place 0.4 mg under the tongue every 5 (five) minutes as needed.        . simvastatin (ZOCOR) 80 MG tablet 1 tablet daily or as directed  90 tablet  1  . warfarin (COUMADIN) 5 MG tablet TAKE 1 TABLET BY MOUTH AS DIRECTED EVERY DAY EXCEPT TUESDAY AND THURSDAY TAKE 1/2 TABLET  81 tablet  10    Allergies  Allergen Reactions  . Morphine Sulfate     REACTION: unspecified    History   Social History  . Marital Status: Married    Spouse Name: N/A    Number of Children: N/A  . Years of Education: N/A   Occupational History  .  Not on file.   Social History Main Topics  . Smoking status: Never Smoker   . Smokeless tobacco: Not on file  . Alcohol Use: No  . Drug Use: No  . Sexually Active:    Other Topics Concern  . Not on file   Social History Narrative  . No narrative on file    Family History  Problem Relation Age of Onset  . Diabetes Mother   . Stroke Mother   . Leukemia Sister   . Cirrhosis Son     s/p liver transplant    Review of Systems:  As stated in the HPI and otherwise negative.   BP 127/81  Pulse 96  Ht 5\' 8"  (1.727 m)  Wt 168 lb 6.4 oz (76.386 kg)  BMI 25.61 kg/m2  Physical Examination: General: Well developed, well nourished, NAD HEENT: OP clear, mucus membranes moist SKIN: warm, dry. No rashes. Neuro: No focal deficits Musculoskeletal: Muscle strength 5/5 all ext Psychiatric: Mood and affect normal Neck: No JVD, no carotid bruits, no thyromegaly, no lymphadenopathy. Lungs:Clear bilaterally, no wheezes, rhonci, crackles Cardiovascular: Regular rate and rhythm.  No murmurs, gallops or rubs. Abdomen:Soft. Bowel sounds present. Non-tender.  Extremities: No lower extremity edema. Pulses are trace  in the bilateral DP/PT.  EKG: NSR, rate 86 bpm. LAD. Inferior infarct, anteroseptal infarct.

## 2010-12-29 NOTE — Assessment & Plan Note (Signed)
Stable. No changes. Continue current therapy.  

## 2010-12-29 NOTE — Patient Instructions (Signed)
Your physician wants you to follow-up in:  6 months. You will receive a reminder letter in the mail two months in advance. If you don't receive a letter, please call our office to schedule the follow-up appointment.   

## 2011-01-06 ENCOUNTER — Ambulatory Visit: Payer: Medicare Other

## 2011-01-06 ENCOUNTER — Encounter (INDEPENDENT_AMBULATORY_CARE_PROVIDER_SITE_OTHER): Payer: Medicare Other | Admitting: Internal Medicine

## 2011-01-06 DIAGNOSIS — I82409 Acute embolism and thrombosis of unspecified deep veins of unspecified lower extremity: Secondary | ICD-10-CM

## 2011-01-06 DIAGNOSIS — I2699 Other pulmonary embolism without acute cor pulmonale: Secondary | ICD-10-CM

## 2011-01-06 NOTE — Patient Instructions (Signed)
  Latest dosing instructions   Total Sun Mon Tue Wed Thu Fri Sat   40 5 mg 7.5 mg 5 mg 5 mg 5 mg 7.5 mg 5 mg    (5 mg1) (5 mg1.5) (5 mg1) (5 mg1) (5 mg1) (5 mg1.5) (5 mg1)        

## 2011-01-20 ENCOUNTER — Ambulatory Visit: Payer: Medicare Other

## 2011-02-03 ENCOUNTER — Ambulatory Visit (INDEPENDENT_AMBULATORY_CARE_PROVIDER_SITE_OTHER): Payer: Medicare Other | Admitting: Internal Medicine

## 2011-02-03 DIAGNOSIS — I82409 Acute embolism and thrombosis of unspecified deep veins of unspecified lower extremity: Secondary | ICD-10-CM

## 2011-02-03 DIAGNOSIS — I2699 Other pulmonary embolism without acute cor pulmonale: Secondary | ICD-10-CM

## 2011-02-03 NOTE — Patient Instructions (Signed)
  Latest dosing instructions   Total Sun Mon Tue Wed Thu Fri Sat   42.5 5 mg 7.5 mg 5 mg 7.5 mg 5 mg 7.5 mg 5 mg    (5 mg1) (5 mg1.5) (5 mg1) (5 mg1.5) (5 mg1) (5 mg1.5) (5 mg1)        

## 2011-02-17 ENCOUNTER — Ambulatory Visit (INDEPENDENT_AMBULATORY_CARE_PROVIDER_SITE_OTHER): Payer: Medicare Other | Admitting: Internal Medicine

## 2011-02-17 DIAGNOSIS — I2699 Other pulmonary embolism without acute cor pulmonale: Secondary | ICD-10-CM

## 2011-02-17 DIAGNOSIS — I82409 Acute embolism and thrombosis of unspecified deep veins of unspecified lower extremity: Secondary | ICD-10-CM

## 2011-02-17 NOTE — Patient Instructions (Signed)
  Latest dosing instructions   Total Sun Mon Tue Wed Thu Fri Sat   42.5 5 mg 7.5 mg 5 mg 7.5 mg 5 mg 7.5 mg 5 mg    (5 mg1) (5 mg1.5) (5 mg1) (5 mg1.5) (5 mg1) (5 mg1.5) (5 mg1)        

## 2011-03-18 ENCOUNTER — Ambulatory Visit: Payer: Medicare Other

## 2011-03-24 ENCOUNTER — Ambulatory Visit (INDEPENDENT_AMBULATORY_CARE_PROVIDER_SITE_OTHER): Payer: Medicare Other | Admitting: Internal Medicine

## 2011-03-24 DIAGNOSIS — I82409 Acute embolism and thrombosis of unspecified deep veins of unspecified lower extremity: Secondary | ICD-10-CM

## 2011-03-24 DIAGNOSIS — I2699 Other pulmonary embolism without acute cor pulmonale: Secondary | ICD-10-CM

## 2011-03-24 NOTE — Patient Instructions (Signed)
  Latest dosing instructions   Total Sun Mon Tue Wed Thu Fri Sat   42.5 5 mg 7.5 mg 5 mg 7.5 mg 5 mg 7.5 mg 5 mg    (5 mg1) (5 mg1.5) (5 mg1) (5 mg1.5) (5 mg1) (5 mg1.5) (5 mg1)        

## 2011-04-14 ENCOUNTER — Ambulatory Visit (INDEPENDENT_AMBULATORY_CARE_PROVIDER_SITE_OTHER): Payer: Medicare Other | Admitting: *Deleted

## 2011-04-14 DIAGNOSIS — Z7901 Long term (current) use of anticoagulants: Secondary | ICD-10-CM

## 2011-04-14 DIAGNOSIS — I82409 Acute embolism and thrombosis of unspecified deep veins of unspecified lower extremity: Secondary | ICD-10-CM

## 2011-04-14 DIAGNOSIS — I2699 Other pulmonary embolism without acute cor pulmonale: Secondary | ICD-10-CM

## 2011-04-14 NOTE — Patient Instructions (Signed)
  Latest dosing instructions   Total Sun Mon Tue Wed Thu Fri Sat   35 5 mg 5 mg 5 mg 5 mg 5 mg 5 mg 5 mg    (5 mg1) (5 mg1) (5 mg1) (5 mg1) (5 mg1) (5 mg1) (5 mg1)        

## 2011-04-21 ENCOUNTER — Ambulatory Visit (INDEPENDENT_AMBULATORY_CARE_PROVIDER_SITE_OTHER): Payer: Medicare Other | Admitting: Internal Medicine

## 2011-04-21 ENCOUNTER — Ambulatory Visit: Payer: Medicare Other

## 2011-04-21 DIAGNOSIS — I2699 Other pulmonary embolism without acute cor pulmonale: Secondary | ICD-10-CM

## 2011-04-21 DIAGNOSIS — I82409 Acute embolism and thrombosis of unspecified deep veins of unspecified lower extremity: Secondary | ICD-10-CM

## 2011-04-21 LAB — POCT INR: INR: 2.3

## 2011-04-21 NOTE — Patient Instructions (Signed)
  Latest dosing instructions   Total Sun Mon Tue Wed Thu Fri Sat   35 5 mg 5 mg 5 mg 5 mg 5 mg 5 mg 5 mg    (5 mg1) (5 mg1) (5 mg1) (5 mg1) (5 mg1) (5 mg1) (5 mg1)        

## 2011-05-05 ENCOUNTER — Ambulatory Visit (INDEPENDENT_AMBULATORY_CARE_PROVIDER_SITE_OTHER): Payer: Medicare Other | Admitting: *Deleted

## 2011-05-05 DIAGNOSIS — I2699 Other pulmonary embolism without acute cor pulmonale: Secondary | ICD-10-CM

## 2011-05-05 DIAGNOSIS — Z7901 Long term (current) use of anticoagulants: Secondary | ICD-10-CM

## 2011-05-05 DIAGNOSIS — I82409 Acute embolism and thrombosis of unspecified deep veins of unspecified lower extremity: Secondary | ICD-10-CM

## 2011-05-05 LAB — POCT INR: INR: 1.7

## 2011-05-05 NOTE — Patient Instructions (Signed)
  Latest dosing instructions   Total Sun Mon Tue Wed Thu Fri Sat   40 5 mg 7.5 mg 5 mg 5 mg 7.5 mg 5 mg 5 mg    (5 mg1) (5 mg1.5) (5 mg1) (5 mg1) (5 mg1.5) (5 mg1) (5 mg1)        

## 2011-05-26 ENCOUNTER — Ambulatory Visit (INDEPENDENT_AMBULATORY_CARE_PROVIDER_SITE_OTHER): Payer: Medicare Other

## 2011-05-26 DIAGNOSIS — Z7901 Long term (current) use of anticoagulants: Secondary | ICD-10-CM

## 2011-05-26 DIAGNOSIS — I2699 Other pulmonary embolism without acute cor pulmonale: Secondary | ICD-10-CM

## 2011-05-26 LAB — POCT INR: INR: 1.5

## 2011-05-26 NOTE — Patient Instructions (Signed)
  Latest dosing instructions   Total Sun Mon Tue Wed Thu Fri Sat   42.5 5 mg 7.5 mg 5 mg 7.5 mg 5 mg 7.5 mg 5 mg    (5 mg1) (5 mg1.5) (5 mg1) (5 mg1.5) (5 mg1) (5 mg1.5) (5 mg1)        

## 2011-06-08 ENCOUNTER — Ambulatory Visit (INDEPENDENT_AMBULATORY_CARE_PROVIDER_SITE_OTHER): Payer: Medicare Other | Admitting: Internal Medicine

## 2011-06-08 VITALS — BP 130/74 | HR 92 | Temp 98.0°F

## 2011-06-08 DIAGNOSIS — I10 Essential (primary) hypertension: Secondary | ICD-10-CM

## 2011-06-08 DIAGNOSIS — I252 Old myocardial infarction: Secondary | ICD-10-CM

## 2011-06-08 DIAGNOSIS — E785 Hyperlipidemia, unspecified: Secondary | ICD-10-CM

## 2011-06-08 DIAGNOSIS — N259 Disorder resulting from impaired renal tubular function, unspecified: Secondary | ICD-10-CM

## 2011-06-08 DIAGNOSIS — I2510 Atherosclerotic heart disease of native coronary artery without angina pectoris: Secondary | ICD-10-CM

## 2011-06-08 DIAGNOSIS — D649 Anemia, unspecified: Secondary | ICD-10-CM

## 2011-06-08 DIAGNOSIS — I2699 Other pulmonary embolism without acute cor pulmonale: Secondary | ICD-10-CM

## 2011-06-08 DIAGNOSIS — I82409 Acute embolism and thrombosis of unspecified deep veins of unspecified lower extremity: Secondary | ICD-10-CM

## 2011-06-08 LAB — BASIC METABOLIC PANEL
Calcium: 9.9 mg/dL (ref 8.4–10.5)
GFR: 50.58 mL/min — ABNORMAL LOW (ref >60.00)
Glucose, Bld: 94 mg/dL (ref 70–99)
Potassium: 4.5 mEq/L (ref 3.5–5.1)
Sodium: 140 mEq/L (ref 135–145)

## 2011-06-08 LAB — CBC WITH DIFFERENTIAL/PLATELET
Basophils Absolute: 0 10*3/uL (ref 0.0–0.1)
Eosinophils Absolute: 0.1 10*3/uL (ref 0.0–0.7)
HCT: 44.8 % (ref 39.0–52.0)
Lymphocytes Relative: 13.3 % (ref 12.0–46.0)
Lymphs Abs: 1.1 10*3/uL (ref 0.7–4.0)
MCHC: 33.7 g/dL (ref 30.0–36.0)
Monocytes Relative: 10.3 % (ref 3.0–12.0)
Neutro Abs: 6.2 10*3/uL (ref 1.4–7.7)
Platelets: 126 10*3/uL — ABNORMAL LOW (ref 150.0–400.0)
RDW: 14.5 % (ref 11.5–14.6)

## 2011-06-08 LAB — LIPID PANEL
Cholesterol: 152 mg/dL (ref 0–200)
VLDL: 28 mg/dL (ref 0.0–40.0)

## 2011-06-08 LAB — HEPATIC FUNCTION PANEL
AST: 20 U/L (ref 0–37)
Albumin: 3.7 g/dL (ref 3.5–5.2)
Alkaline Phosphatase: 91 U/L (ref 39–117)
Total Bilirubin: 0.5 mg/dL (ref 0.3–1.2)

## 2011-06-08 LAB — TSH: TSH: 0.92 u[IU]/mL (ref 0.35–5.50)

## 2011-06-08 LAB — POCT INR: INR: 4.4

## 2011-06-08 NOTE — Progress Notes (Signed)
Patient ID: [name], male   DOB: 05/02/1919, 78 y.o.   MRN: [id number]  Patient comes in for evaluation with his son. Patient has multiple medical problems including history of pulmonary embolism (recurrent). He is on lifelong warfarin. He's not had any bleeding complications.  Coronary artery disease: Patient denies any chest pain, shortness of breath.  Patient suffers with back pain and has a chronic abnormality of his gait. He uses a walker. He has not had any recent falls.  Past Medical History  Diagnosis Date  . Myocardial infarction   . Peptic ulcer disease   . Hyperlipidemia   . Hypertension   . Hx pulmonary embolism   . Anemia   . CHF (congestive heart failure)   . Deep vein thrombosis (DVT)   . Stroke   . Hypotension     reaction to coreg    History   Social History  . Marital Status: Married    Spouse Name: N/A    Number of Children: N/A  . Years of Education: N/A   Occupational History  . Not on file.   Social History Main Topics  . Smoking status: Never Smoker   . Smokeless tobacco: Not on file  . Alcohol Use: No  . Drug Use: No  . Sexually Active:    Other Topics Concern  . Not on file   Social History Narrative  . No narrative on file    Past Surgical History  Procedure Date  . Appendectomy   . Cataract extraction   . Gastrectomy   . Nephrectomy     benign kidney tumor  . Transluminal angioplasty     Family History  Problem Relation Age of Onset  . Diabetes Mother   . Stroke Mother   . Leukemia Sister   . Cirrhosis Son     s/p liver transplant    Allergies  Allergen Reactions  . Morphine Sulfate     REACTION: unspecified    Current Outpatient Prescriptions on File Prior to Visit  Medication Sig Dispense Refill  . acetaminophen (TYLENOL) 650 MG CR tablet Take 650 mg by mouth every 8 (eight) hours as needed.        Marland Kitchen allopurinol (ZYLOPRIM) 300 MG tablet Take 300 mg by mouth daily.        . Ascorbic Acid (VITAMIN C) 1000 MG  tablet Take 1,000 mg by mouth daily.        Marland Kitchen aspirin 81 MG tablet Take 81 mg by mouth daily.        Marland Kitchen HYDROcodone-acetaminophen (VICODIN) 2.5-500 MG per tablet Take 1 tablet by mouth every 6 (six) hours as needed.        . metoprolol tartrate (LOPRESSOR) 25 MG tablet Take 1/2 tablet by mouth 2 times per day.  60 tablet  11  . nitroGLYCERIN (NITROSTAT) 0.4 MG SL tablet Place 0.4 mg under the tongue every 5 (five) minutes as needed.        . simvastatin (ZOCOR) 80 MG tablet 1 tablet daily or as directed  90 tablet  1     patient denies chest pain, shortness of breath, orthopnea. Denies lower extremity edema, abdominal pain, change in appetite, change in bowel movements. Patient denies rashes, musculoskeletal complaints. No other specific complaints in a complete review of systems.   BP 130/74  Pulse 92  Temp(Src) 98 F (36.7 C) (Oral) Elderly male in no acute distress. He is using a walker. HEENT exam atraumatic, normocephalic, neck supple. Chest is  clear to auscultation cardiac exam S1-S2 are regular. Abdominal exam active bowel sounds, soft. Extremities there is 1+ edema in the pretibial area bilaterally. Neurologic exam he is alert and is able to carry on a complex conversation.

## 2011-06-08 NOTE — Patient Instructions (Signed)
protime in 2 weeks

## 2011-06-10 NOTE — Assessment & Plan Note (Signed)
BP Readings from Last 3 Encounters:  06/08/11 130/74  12/29/10 127/81  12/09/10 110/80   Well-controlled. Continue current medications.

## 2011-06-10 NOTE — Assessment & Plan Note (Signed)
Patient without any current symptoms. Will continue risk factor modification.

## 2011-06-10 NOTE — Assessment & Plan Note (Signed)
Needs lifelong warfarin. Check pro time today.

## 2011-06-22 ENCOUNTER — Ambulatory Visit (INDEPENDENT_AMBULATORY_CARE_PROVIDER_SITE_OTHER): Payer: Medicare Other | Admitting: Internal Medicine

## 2011-06-22 DIAGNOSIS — I2699 Other pulmonary embolism without acute cor pulmonale: Secondary | ICD-10-CM

## 2011-06-22 LAB — POCT INR: INR: 2.4

## 2011-06-22 NOTE — Patient Instructions (Signed)
  Latest dosing instructions   Total Sun Mon Tue Wed Thu Fri Sat   35 5 mg 5 mg 5 mg 5 mg 5 mg 5 mg 5 mg    (5 mg1) (5 mg1) (5 mg1) (5 mg1) (5 mg1) (5 mg1) (5 mg1)        

## 2011-06-29 NOTE — Progress Notes (Signed)
  Subjective:    Patient ID: [name], male    DOB: 11/20/1919, 78 y.o.   MRN: [id number]  HPI This encounter was opened in error   Review of Systems     Objective:   Physical Exam        Assessment & Plan:

## 2011-07-06 ENCOUNTER — Ambulatory Visit (INDEPENDENT_AMBULATORY_CARE_PROVIDER_SITE_OTHER): Payer: Medicare Other | Admitting: *Deleted

## 2011-07-06 DIAGNOSIS — Z7901 Long term (current) use of anticoagulants: Secondary | ICD-10-CM

## 2011-07-06 DIAGNOSIS — I2699 Other pulmonary embolism without acute cor pulmonale: Secondary | ICD-10-CM

## 2011-07-06 LAB — POCT INR: INR: 2.6

## 2011-07-06 NOTE — Patient Instructions (Signed)
  Latest dosing instructions   Total Sun Mon Tue Wed Thu Fri Sat   35 5 mg 5 mg 5 mg 5 mg 5 mg 5 mg 5 mg    (5 mg1) (5 mg1) (5 mg1) (5 mg1) (5 mg1) (5 mg1) (5 mg1)        

## 2011-08-03 ENCOUNTER — Ambulatory Visit (INDEPENDENT_AMBULATORY_CARE_PROVIDER_SITE_OTHER): Payer: Medicare Other | Admitting: Internal Medicine

## 2011-08-03 DIAGNOSIS — I2699 Other pulmonary embolism without acute cor pulmonale: Secondary | ICD-10-CM

## 2011-08-03 NOTE — Patient Instructions (Signed)
  Latest dosing instructions   Total Sun Mon Tue Wed Thu Fri Sat   35 5 mg 5 mg 5 mg 5 mg 5 mg 5 mg 5 mg    (5 mg1) (5 mg1) (5 mg1) (5 mg1) (5 mg1) (5 mg1) (5 mg1)        

## 2011-08-08 ENCOUNTER — Other Ambulatory Visit: Payer: Self-pay | Admitting: *Deleted

## 2011-08-08 MED ORDER — METOPROLOL TARTRATE 25 MG PO TABS: ORAL_TABLET | ORAL | Status: DC

## 2011-08-30 ENCOUNTER — Ambulatory Visit (INDEPENDENT_AMBULATORY_CARE_PROVIDER_SITE_OTHER): Payer: Medicare Other | Admitting: Family

## 2011-08-30 DIAGNOSIS — I2699 Other pulmonary embolism without acute cor pulmonale: Secondary | ICD-10-CM

## 2011-08-30 LAB — POCT INR: INR: 2.7

## 2011-08-30 NOTE — Patient Instructions (Signed)
  Latest dosing instructions   Total Sun Mon Tue Wed Thu Fri Sat   35 5 mg 5 mg 5 mg 5 mg 5 mg 5 mg 5 mg    (5 mg1) (5 mg1) (5 mg1) (5 mg1) (5 mg1) (5 mg1) (5 mg1)        

## 2011-09-27 ENCOUNTER — Ambulatory Visit (INDEPENDENT_AMBULATORY_CARE_PROVIDER_SITE_OTHER): Payer: Medicare Other | Admitting: Family

## 2011-09-27 DIAGNOSIS — I2699 Other pulmonary embolism without acute cor pulmonale: Secondary | ICD-10-CM

## 2011-09-27 LAB — POCT INR: INR: 3.6

## 2011-09-27 NOTE — Patient Instructions (Addendum)
Hold today's dose. Then continue 5 mg (1 pill) everyday. Check in 3 weeks.    Latest dosing instructions   Total Sun Mon Tue Wed Thu Fri Sat   35 5 mg 5 mg 5 mg 5 mg 5 mg 5 mg 5 mg    (5 mg1) (5 mg1) (5 mg1) (5 mg1) (5 mg1) (5 mg1) (5 mg1)

## 2011-10-18 ENCOUNTER — Ambulatory Visit (INDEPENDENT_AMBULATORY_CARE_PROVIDER_SITE_OTHER): Payer: Medicare Other | Admitting: Family

## 2011-10-18 DIAGNOSIS — I2699 Other pulmonary embolism without acute cor pulmonale: Secondary | ICD-10-CM

## 2011-10-18 LAB — POCT INR: INR: 2.4

## 2011-10-18 NOTE — Patient Instructions (Addendum)
Continue 1 tablet (5mg ) daily.  Recheck in 4 weeks    Latest dosing instructions   Total Sun Mon Tue Wed Thu Fri Sat   35 5 mg 5 mg 5 mg 5 mg 5 mg 5 mg 5 mg    (5 mg1) (5 mg1) (5 mg1) (5 mg1) (5 mg1) (5 mg1) (5 mg1)

## 2011-11-15 ENCOUNTER — Ambulatory Visit (INDEPENDENT_AMBULATORY_CARE_PROVIDER_SITE_OTHER): Payer: Medicare Other | Admitting: Family

## 2011-11-15 DIAGNOSIS — I2699 Other pulmonary embolism without acute cor pulmonale: Secondary | ICD-10-CM

## 2011-11-15 NOTE — Patient Instructions (Addendum)
  Latest dosing instructions   Total Sun Mon Tue Wed Thu Fri Sat   35 5 mg 5 mg 5 mg 5 mg 5 mg 5 mg 5 mg    (5 mg1) (5 mg1) (5 mg1) (5 mg1) (5 mg1) (5 mg1) (5 mg1)       Today and tomorrow 2 tabs today and tomorrow. Then continue 1 tablet (5mg ) daily.  Recheck in 2 weeks.

## 2011-11-21 DIAGNOSIS — Z0279 Encounter for issue of other medical certificate: Secondary | ICD-10-CM

## 2011-11-24 ENCOUNTER — Ambulatory Visit (INDEPENDENT_AMBULATORY_CARE_PROVIDER_SITE_OTHER): Payer: Medicare Other | Admitting: *Deleted

## 2011-11-24 DIAGNOSIS — I2699 Other pulmonary embolism without acute cor pulmonale: Secondary | ICD-10-CM

## 2011-11-24 DIAGNOSIS — Z0000 Encounter for general adult medical examination without abnormal findings: Secondary | ICD-10-CM

## 2011-11-24 LAB — POCT INR: INR: 1.2

## 2011-11-24 NOTE — Patient Instructions (Signed)
  Latest dosing instructions   Total Sun Mon Tue Wed Thu Fri Sat   45 5 mg 5 mg 10 mg 5 mg 10 mg 5 mg 5 mg    (5 mg1) (5 mg1) (5 mg2) (5 mg1) (5 mg2) (5 mg1) (5 mg1)        

## 2011-11-29 ENCOUNTER — Encounter: Payer: Medicare Other | Admitting: Family

## 2011-11-29 DIAGNOSIS — Z0289 Encounter for other administrative examinations: Secondary | ICD-10-CM

## 2011-12-07 ENCOUNTER — Telehealth: Payer: Self-pay | Admitting: Internal Medicine

## 2011-12-07 NOTE — Telephone Encounter (Signed)
Pt scheduled for 12/08/11 at 11

## 2011-12-07 NOTE — Telephone Encounter (Signed)
Pts son called and said that he was told at last coumadin check that the next coumadin appt would be on 12/08/11 at 9:50am. No appt was schd. [facility] in [facility] living and they provide transportation and the only time they could bring the pt on 12/08/11 is at 11am. Pls advise is ok.

## 2011-12-08 ENCOUNTER — Encounter: Payer: Medicare Other | Admitting: Family

## 2011-12-08 DIAGNOSIS — Z0289 Encounter for other administrative examinations: Secondary | ICD-10-CM

## 2011-12-08 LAB — PROTIME-INR

## 2011-12-09 ENCOUNTER — Telehealth: Payer: Self-pay | Admitting: Family

## 2011-12-09 ENCOUNTER — Ambulatory Visit (INDEPENDENT_AMBULATORY_CARE_PROVIDER_SITE_OTHER): Payer: Medicare Other | Admitting: Family

## 2011-12-09 DIAGNOSIS — I2699 Other pulmonary embolism without acute cor pulmonale: Secondary | ICD-10-CM

## 2011-12-09 LAB — POCT INR: INR: 2.5

## 2011-12-09 NOTE — Telephone Encounter (Signed)
Continue Coumadin at the same dose. Recheck in 1 month

## 2011-12-09 NOTE — Telephone Encounter (Signed)
Left detailed message at [facility] to notify facility of note

## 2011-12-09 NOTE — Patient Instructions (Signed)
Take 10mg  ( 2 tabs) on tuesdays and thursdays Then continue 1 tablet (5mg ) daily.  Recheck in 4 weeks.

## 2011-12-20 ENCOUNTER — Encounter: Payer: Self-pay | Admitting: Internal Medicine

## 2011-12-21 ENCOUNTER — Telehealth: Payer: Self-pay | Admitting: Internal Medicine

## 2011-12-21 NOTE — Telephone Encounter (Signed)
Order faxed.

## 2011-12-21 NOTE — Telephone Encounter (Signed)
[name] is still waiting for written order for Coumadin. Need any changes and recheck date. Has to be signed by Adline Mango. Pls fax to fax # [phone]

## 2012-01-06 ENCOUNTER — Ambulatory Visit: Payer: Self-pay | Admitting: Family

## 2012-01-06 DIAGNOSIS — I2699 Other pulmonary embolism without acute cor pulmonale: Secondary | ICD-10-CM

## 2012-01-06 NOTE — Patient Instructions (Signed)
Take 7.5mg  ( 1.5 tabs) on tuesdays and thursdays Then continue 1 tablet (5mg ) daily.  Recheck in 2 weeks.    Latest dosing instructions   Total Sun Mon Tue Wed Thu Fri Sat   40 5 mg 5 mg 7.5 mg 5 mg 7.5 mg 5 mg 5 mg    (5 mg1) (5 mg1) (5 mg1.5) (5 mg1) (5 mg1.5) (5 mg1) (5 mg1)

## 2012-02-02 ENCOUNTER — Telehealth: Payer: Self-pay | Admitting: Family

## 2012-02-02 ENCOUNTER — Telehealth: Payer: Self-pay | Admitting: Family Medicine

## 2012-02-02 ENCOUNTER — Telehealth: Payer: Self-pay | Admitting: Internal Medicine

## 2012-02-02 NOTE — Telephone Encounter (Signed)
Pts son called and said that pt is at [facility] and gets coumadin lvls checked at their facility. The PT appt for Monday 02/06/12 has been cx.

## 2012-02-02 NOTE — Telephone Encounter (Signed)
Spoke with Jasmine December and pt's inr will be done Friday 02/03/12 or Monday 02/06/12. Will call and fax results

## 2012-02-02 NOTE — Telephone Encounter (Signed)
[facility] calling back. Requesting order to draw pt/INR (draw tomorrow or Monday). Must be written order faxed to: [phone] (Attn: Sherron). Thanks.

## 2012-02-02 NOTE — Telephone Encounter (Signed)
Needs INR checked asap!! 

## 2012-02-02 NOTE — Telephone Encounter (Signed)
Appointment scheduled.

## 2012-02-02 NOTE — Telephone Encounter (Signed)
Memory lapse on my part. I knew that. Will call [facility] for inr results

## 2012-02-03 NOTE — Telephone Encounter (Signed)
Order faxed.

## 2012-02-06 ENCOUNTER — Ambulatory Visit (INDEPENDENT_AMBULATORY_CARE_PROVIDER_SITE_OTHER): Payer: Medicare Other | Admitting: Family

## 2012-02-06 ENCOUNTER — Encounter: Payer: Medicare Other | Admitting: Family

## 2012-02-06 DIAGNOSIS — I2699 Other pulmonary embolism without acute cor pulmonale: Secondary | ICD-10-CM

## 2012-02-06 NOTE — Patient Instructions (Addendum)
Hold Coumadin x 2 days. Take 1 tablet (5mg ) daily.  Recheck in 2 weeks.    Latest dosing instructions   Total Sun Mon Tue Wed Thu Fri Sat   35 5 mg 5 mg 5 mg 5 mg 5 mg 5 mg 5 mg    (5 mg1) (5 mg1) (5 mg1) (5 mg1) (5 mg1) (5 mg1) (5 mg1)

## 2012-02-09 ENCOUNTER — Telehealth: Payer: Self-pay | Admitting: Internal Medicine

## 2012-02-09 NOTE — Telephone Encounter (Signed)
Spoke with Dr. Amador Cunas regarding patient, he recommends pt come in to be seen this afternoon.  I notified both Jasmine December at [facility] and pt's daughter (Dr. Cain Saupe) of Dr. Charm Rings recommendations.  The daughter states that she prefers the pt to wait to see Dr. Cato Mulligan because she does not think this is an urgent concern due to the fact the pt has said that he is tired of living for the past year now.  She also states she is concerned about him being put on medication because of the sedatives that would possible increase his fall risk. Due to daughter's request, an appt was scheduled with Dr. Cato Mulligan on 11/15 @ 9:30am.

## 2012-02-09 NOTE — Telephone Encounter (Signed)
Sherron/care giver at [facility] called re: getting pt in today for ov. Said that they can get pt in the car at their facility, but would need a nurse at [facility] to help the pt get out of the car. Pls call.

## 2012-02-09 NOTE — Telephone Encounter (Signed)
Caller: Sherron/Care Giver at [facility]isted Living; Patient Name: [name]; PCP: [facility] (Adults only); Best Callback Phone Number: [phone]; Reason for call: Confusion; Caregiver is calling and states that pt has been crying and feeling very depressed; stating that he wishes he would die; sx started approximately 1 month ago; denies cryingtoday; but still very depressed; caregiver states that pt had a lifestyle change approx 3 months ago; wife is not very nice to him but pt doesn't report what is wrong with him; Caregiver has sent 2 faxes reporting this sx and she has never received a response from the office; fax was sent to the [phone]; showing loss of interest; hard to get motivated; Triaged per Suicidal, Homicidal or Harmful Behavior Guideline; See in ED Immediately due to experiencing increase of suicidal or self destructive thoughts; Assisted Living Facility would like to try and start him on medication to help with his depression if MD feels this is appropriate or an OV can be made; caregiver doesn't feel that pt will actually harm himself;  RN instructed caregiver of the appts that are available for today in the office; caregiver will call family and see what time will work for family to bring pt to the office; caregiver will  call us back to schedule an appt for today; OFFICE PLEASE REVIEW REGARDING SEE IN ED IMMEDIATELY DISPOSITION AND CAREGIVER TO CALL BACK FOR AN APPT FOR TODAY ONCE THEY TALK TO FAMILY REGARDING TRANSPORTATION- Suandrea at office called and made aware of situation

## 2012-02-09 NOTE — Telephone Encounter (Signed)
Dr Deboraha Sprang, Daughter of pt called and stated that pt has been depressed for 9 mths and prays to die everyday.  He is not suicidal and pts daughter does not feel like he needs to be seen today.  Pts daughter said she was going to call [facility] and discuss this with them.

## 2012-02-10 NOTE — Telephone Encounter (Signed)
Let's do this--- Start celexa 20 mg po qd. #30/ 3 refills See me in the next few weeks  Inform patient's dtr

## 2012-02-10 NOTE — Telephone Encounter (Signed)
pts daughter would like to talk to you before he starts any medicine.  She does not want to cancel the appt

## 2012-02-17 ENCOUNTER — Ambulatory Visit (INDEPENDENT_AMBULATORY_CARE_PROVIDER_SITE_OTHER): Payer: Medicare Other | Admitting: Internal Medicine

## 2012-02-17 ENCOUNTER — Ambulatory Visit (INDEPENDENT_AMBULATORY_CARE_PROVIDER_SITE_OTHER): Payer: Medicare Other | Admitting: Family

## 2012-02-17 ENCOUNTER — Encounter: Payer: Self-pay | Admitting: Internal Medicine

## 2012-02-17 VITALS — BP 170/98 | HR 84 | Temp 98.0°F | Wt 171.0 lb

## 2012-02-17 DIAGNOSIS — I2699 Other pulmonary embolism without acute cor pulmonale: Secondary | ICD-10-CM

## 2012-02-17 DIAGNOSIS — Z7189 Other specified counseling: Secondary | ICD-10-CM

## 2012-02-17 NOTE — Patient Instructions (Addendum)
Take 1 tablet (5mg ) daily.  Recheck in 2 weeks.    Latest dosing instructions   Total Sun Mon Tue Wed Thu Fri Sat   35 5 mg 5 mg 5 mg 5 mg 5 mg 5 mg 5 mg    (5 mg1) (5 mg1) (5 mg1) (5 mg1) (5 mg1) (5 mg1) (5 mg1)

## 2012-02-19 NOTE — Addendum Note (Signed)
Addended by: Lindley Magnus on: 02/19/2012 07:02 AM   Modules accepted: Orders

## 2012-02-19 NOTE — Progress Notes (Signed)
Patient ID: [name], male   DOB: 05/29/1919, 78 y.o.   MRN: [id number] Pt comes in with son-in-law (Dr Sheldon Silvan). Pt's assisted living called and said patient was depressed and suicidal. Patient and son-in-law completely disagree. Patient states that he does mention that he would like to in his sleep.  A/p 25 minute discussion with patient and Dr. Ivin Booty. Explained DNR and out of hospital arrest. Patient requests that I complete that form.  While the patient was here, i rechecked INR.

## 2012-02-20 ENCOUNTER — Ambulatory Visit (INDEPENDENT_AMBULATORY_CARE_PROVIDER_SITE_OTHER): Payer: Medicare Other | Admitting: Family

## 2012-02-20 DIAGNOSIS — I2699 Other pulmonary embolism without acute cor pulmonale: Secondary | ICD-10-CM

## 2012-02-20 NOTE — Patient Instructions (Addendum)
Take 1 tablet (5mg ) daily.  Recheck in 3 weeks.    Latest dosing instructions   Total Sun Mon Tue Wed Thu Fri Sat   35 5 mg 5 mg 5 mg 5 mg 5 mg 5 mg 5 mg    (5 mg1) (5 mg1) (5 mg1) (5 mg1) (5 mg1) (5 mg1) (5 mg1)

## 2012-03-14 ENCOUNTER — Telehealth: Payer: Self-pay | Admitting: Family

## 2012-03-14 NOTE — Telephone Encounter (Signed)
Spoke with Lupita Leash at [facility]. She states that if it hasn't been done, she will have them check pt/inr tomorrow

## 2012-03-14 NOTE — Telephone Encounter (Signed)
We need Mr. [name] INR results from the facility. Thank you. Was due 12/9

## 2012-03-16 ENCOUNTER — Ambulatory Visit (INDEPENDENT_AMBULATORY_CARE_PROVIDER_SITE_OTHER): Payer: Medicare Other | Admitting: Family

## 2012-03-16 DIAGNOSIS — I2699 Other pulmonary embolism without acute cor pulmonale: Secondary | ICD-10-CM

## 2012-03-16 NOTE — Patient Instructions (Signed)
Take 1 tablet (5mg ) daily.  Recheck in 4 weeks.

## 2012-04-13 ENCOUNTER — Ambulatory Visit (INDEPENDENT_AMBULATORY_CARE_PROVIDER_SITE_OTHER): Payer: Medicare Other | Admitting: Family

## 2012-04-13 ENCOUNTER — Telehealth: Payer: Self-pay | Admitting: Family

## 2012-04-13 DIAGNOSIS — I2699 Other pulmonary embolism without acute cor pulmonale: Secondary | ICD-10-CM

## 2012-04-13 LAB — POCT INR: INR: 2.7

## 2012-04-13 NOTE — Patient Instructions (Addendum)
Take 1 tablet (5mg ) daily.  Recheck in 6 weeks.    Latest dosing instructions   Total Sun Mon Tue Wed Thu Fri Sat   35 5 mg 5 mg 5 mg 5 mg 5 mg 5 mg 5 mg    (5 mg1) (5 mg1) (5 mg1) (5 mg1) (5 mg1) (5 mg1) (5 mg1)

## 2012-04-13 NOTE — Telephone Encounter (Signed)
Instructions faxed

## 2012-04-13 NOTE — Telephone Encounter (Signed)
Continue current dosage of Coumadin and recheck in 6 weeks    Latest dosing instructions   Total Sun Mon Tue Wed Thu Fri Sat   35 5 mg 5 mg 5 mg 5 mg 5 mg 5 mg 5 mg    (5 mg1) (5 mg1) (5 mg1) (5 mg1) (5 mg1) (5 mg1) (5 mg1)

## 2012-05-23 ENCOUNTER — Ambulatory Visit (INDEPENDENT_AMBULATORY_CARE_PROVIDER_SITE_OTHER): Payer: Medicare Other | Admitting: Internal Medicine

## 2012-05-23 VITALS — BP 130/74 | HR 88 | Temp 98.1°F

## 2012-05-23 DIAGNOSIS — N259 Disorder resulting from impaired renal tubular function, unspecified: Secondary | ICD-10-CM

## 2012-05-23 DIAGNOSIS — E785 Hyperlipidemia, unspecified: Secondary | ICD-10-CM

## 2012-05-23 DIAGNOSIS — R269 Unspecified abnormalities of gait and mobility: Secondary | ICD-10-CM

## 2012-05-23 DIAGNOSIS — I2510 Atherosclerotic heart disease of native coronary artery without angina pectoris: Secondary | ICD-10-CM

## 2012-05-23 DIAGNOSIS — I82409 Acute embolism and thrombosis of unspecified deep veins of unspecified lower extremity: Secondary | ICD-10-CM

## 2012-05-23 DIAGNOSIS — I10 Essential (primary) hypertension: Secondary | ICD-10-CM

## 2012-05-23 DIAGNOSIS — I2699 Other pulmonary embolism without acute cor pulmonale: Secondary | ICD-10-CM

## 2012-05-23 LAB — HEPATIC FUNCTION PANEL
ALT: 12 U/L (ref 0–53)
Total Protein: 6.9 g/dL (ref 6.0–8.3)

## 2012-05-23 LAB — BASIC METABOLIC PANEL
BUN: 26 mg/dL — ABNORMAL HIGH (ref 6–23)
CO2: 22 mEq/L (ref 19–32)
Calcium: 9.6 mg/dL (ref 8.4–10.5)
Chloride: 108 mEq/L (ref 96–112)
Creatinine, Ser: 1.6 mg/dL — ABNORMAL HIGH (ref 0.4–1.5)

## 2012-05-23 LAB — TSH: TSH: 1.6 u[IU]/mL (ref 0.35–5.50)

## 2012-05-23 LAB — CBC WITH DIFFERENTIAL/PLATELET
Basophils Relative: 0.1 % (ref 0.0–3.0)
Eosinophils Absolute: 0.2 10*3/uL (ref 0.0–0.7)
HCT: 38.1 % — ABNORMAL LOW (ref 39.0–52.0)
Hemoglobin: 12.5 g/dL — ABNORMAL LOW (ref 13.0–17.0)
Lymphs Abs: 1 10*3/uL (ref 0.7–4.0)
MCHC: 32.8 g/dL (ref 30.0–36.0)
MCV: 99 fl (ref 78.0–100.0)
Monocytes Absolute: 1.1 10*3/uL — ABNORMAL HIGH (ref 0.1–1.0)
Neutro Abs: 6.8 10*3/uL (ref 1.4–7.7)
RBC: 3.85 Mil/uL — ABNORMAL LOW (ref 4.22–5.81)
RDW: 15.8 % — ABNORMAL HIGH (ref 11.5–14.6)

## 2012-05-23 LAB — LIPID PANEL
Cholesterol: 113 mg/dL (ref 0–200)
Triglycerides: 121 mg/dL (ref 0.0–149.0)

## 2012-05-23 NOTE — Progress Notes (Signed)
Patient ID: [name], male   DOB: 15-Jan-1920, 79 y.o.   MRN: [id number]   Very complicated elderly patient- Pulmonary embolus- on chronic warfarin- risks have always outweighed risks  Gout- no recurrence  Lipids- tolerating meds  Has had recent falls- using a rolling walker  Reviewed pmh, psh, sochx, famhx   patient denies chest pain.. Denies  abdominal pain, change in appetite, change in bowel movements. Patient denies rashes, musculoskeletal complaints. No other specific complaints in a complete review of systems.  Has chronic SOB  elderly male in no acute distress. HEENT exam atraumatic, normocephalic, neck supple without jugular venous distention. Chest clear to auscultation cardiac exam S1-S2 are regular. Abdominal exam overweight with bowel sounds, soft and nontender. Extremities 1+ edema. Neurologic exam is alert with a shuffling gait.

## 2012-05-23 NOTE — Patient Instructions (Signed)
Anticoagulation Dose Instructions as of 05/23/2012     [name] Tue Wed Thu Fri Sat   New Dose 5 mg 5 mg 5 mg 5 mg 5 mg 5 mg 5 mg    Description       Take 1 tablet (5mg ) daily.  Recheck in 6 weeks.

## 2012-05-24 NOTE — Assessment & Plan Note (Signed)
He is at risk of fall

## 2012-05-24 NOTE — Assessment & Plan Note (Signed)
Risk of warfarin is very high Risk of stopping warfarin is likely higher

## 2012-05-24 NOTE — Assessment & Plan Note (Signed)
Ok to continue same

## 2012-05-28 ENCOUNTER — Telehealth: Payer: Self-pay | Admitting: Internal Medicine

## 2012-05-28 NOTE — Telephone Encounter (Signed)
Rolm Gala Needs verbal order to start home health for pt. Eval done today, recommends 3 x week for 4 weeks.

## 2012-06-06 ENCOUNTER — Telehealth: Payer: Self-pay | Admitting: *Deleted

## 2012-06-06 NOTE — Telephone Encounter (Signed)
Joyce Gross, RN with [facility] called and pt is having left calf pain, swelling, tightness, and warmth.  Pt is coumadin.  Per Dr Cato Mulligan do a venous doppler and check to see if pt has fever so we can call in antibiotic.  Pt has no fever 97.4.  Verbal order given, Dr Cato Mulligan aware of temperature

## 2012-06-07 DIAGNOSIS — Z7901 Long term (current) use of anticoagulants: Secondary | ICD-10-CM

## 2012-06-07 DIAGNOSIS — I10 Essential (primary) hypertension: Secondary | ICD-10-CM

## 2012-06-07 DIAGNOSIS — I2510 Atherosclerotic heart disease of native coronary artery without angina pectoris: Secondary | ICD-10-CM

## 2012-06-07 DIAGNOSIS — Z5189 Encounter for other specified aftercare: Secondary | ICD-10-CM

## 2012-06-07 DIAGNOSIS — R269 Unspecified abnormalities of gait and mobility: Secondary | ICD-10-CM

## 2012-06-20 ENCOUNTER — Telehealth: Payer: Self-pay | Admitting: Internal Medicine

## 2012-06-20 NOTE — Telephone Encounter (Signed)
Opened in error

## 2012-06-20 NOTE — Telephone Encounter (Signed)
Left message for Rosanne Ashing to call back and give what orders he is requesting.  He will need to talk to [facility]

## 2012-06-20 NOTE — Telephone Encounter (Signed)
Ok per Dr Cato Mulligan, left message on Jim's voicemail with verbal order

## 2012-06-20 NOTE — Telephone Encounter (Signed)
Rosanne Ashing is calling from home healthcare, and is requesting verbal orders from Dr. Pryor Curia to continue to treat Mr. Wrightson. Please assist.

## 2012-06-20 NOTE — Telephone Encounter (Signed)
Occupational therapy . Okay to give verbal order?

## 2012-06-22 ENCOUNTER — Telehealth: Payer: Self-pay | Admitting: Internal Medicine

## 2012-06-22 NOTE — Telephone Encounter (Signed)
Revonda Standard Physical Therapist wanting to extend PT treatments for patient to  2 X / week for 4 wks.  Verbal approval ok. Pls call [phone] [employer]  At [facility]

## 2012-06-22 NOTE — Telephone Encounter (Signed)
Ok per Dr Cato Mulligan, left message on machine with verbal order

## 2012-07-10 ENCOUNTER — Telehealth: Payer: Self-pay | Admitting: Family

## 2012-07-10 NOTE — Telephone Encounter (Signed)
Left message on nurse line at [facility] for pt to have pt/inr done asap and fax results to office. Office phone and fax # provided

## 2012-07-10 NOTE — Telephone Encounter (Signed)
Needs an INR checked asap!

## 2012-07-16 NOTE — Telephone Encounter (Signed)
Order faxed.

## 2012-07-23 ENCOUNTER — Encounter: Payer: Self-pay | Admitting: Family

## 2012-07-23 ENCOUNTER — Ambulatory Visit (INDEPENDENT_AMBULATORY_CARE_PROVIDER_SITE_OTHER): Payer: Medicare Other | Admitting: Family

## 2012-07-23 DIAGNOSIS — I2699 Other pulmonary embolism without acute cor pulmonale: Secondary | ICD-10-CM

## 2012-07-23 NOTE — Patient Instructions (Signed)
Take 1 tablet (5mg ) daily.  Recheck in 4 weeks.  Anticoagulation Dose Instructions as of 07/23/2012     Glynis Smiles Tue Wed Thu Fri Sat   New Dose 5 mg 5 mg 5 mg 5 mg 5 mg 5 mg 5 mg    Description       Take 1 tablet (5mg ) daily.  Recheck in 4 weeks.

## 2012-08-03 ENCOUNTER — Encounter: Payer: Self-pay | Admitting: Family

## 2012-09-18 ENCOUNTER — Telehealth: Payer: Self-pay | Admitting: Family

## 2012-09-18 NOTE — Telephone Encounter (Signed)
Please call and schedule INR appointment asap.

## 2012-09-18 NOTE — Telephone Encounter (Signed)
Spoke with Sharolyn Douglas at [facility]. She does note that last order was sent to facility on 08/20/12 but was not done. Requesting that I send order to d/c order on 08/20/12 and draw pt/inr on 09/20/12. Fax number [phone].  Order faxed

## 2012-09-18 NOTE — Telephone Encounter (Signed)
Jasmine December at [facility] to call me back

## 2012-09-20 LAB — PROTIME-INR: INR: 3.5 — AB (ref 0.9–1.1)

## 2012-10-01 ENCOUNTER — Telehealth: Payer: Self-pay | Admitting: Family

## 2012-10-01 ENCOUNTER — Ambulatory Visit (INDEPENDENT_AMBULATORY_CARE_PROVIDER_SITE_OTHER): Payer: Medicare Other | Admitting: Family

## 2012-10-01 ENCOUNTER — Emergency Department (HOSPITAL_COMMUNITY)
Admission: EM | Admit: 2012-10-01 | Discharge: 2012-10-01 | Disposition: A | Discharge status: Home / Self Care | Payer: Medicare Other | Attending: Emergency Medicine | Admitting: Emergency Medicine

## 2012-10-01 ENCOUNTER — Emergency Department (HOSPITAL_COMMUNITY): Payer: Medicare Other

## 2012-10-01 ENCOUNTER — Encounter (HOSPITAL_COMMUNITY): Payer: Self-pay | Admitting: *Deleted

## 2012-10-01 DIAGNOSIS — I2699 Other pulmonary embolism without acute cor pulmonale: Secondary | ICD-10-CM

## 2012-10-01 DIAGNOSIS — Z86711 Personal history of pulmonary embolism: Secondary | ICD-10-CM | POA: Insufficient documentation

## 2012-10-01 DIAGNOSIS — Z885 Allergy status to narcotic agent status: Secondary | ICD-10-CM | POA: Insufficient documentation

## 2012-10-01 DIAGNOSIS — I10 Essential (primary) hypertension: Secondary | ICD-10-CM | POA: Insufficient documentation

## 2012-10-01 DIAGNOSIS — Z7901 Long term (current) use of anticoagulants: Secondary | ICD-10-CM | POA: Insufficient documentation

## 2012-10-01 DIAGNOSIS — Z86718 Personal history of other venous thrombosis and embolism: Secondary | ICD-10-CM | POA: Insufficient documentation

## 2012-10-01 DIAGNOSIS — Z79899 Other long term (current) drug therapy: Secondary | ICD-10-CM | POA: Insufficient documentation

## 2012-10-01 DIAGNOSIS — Z8719 Personal history of other diseases of the digestive system: Secondary | ICD-10-CM | POA: Insufficient documentation

## 2012-10-01 DIAGNOSIS — I252 Old myocardial infarction: Secondary | ICD-10-CM | POA: Insufficient documentation

## 2012-10-01 DIAGNOSIS — Y9210 Unspecified residential institution as the place of occurrence of the external cause: Secondary | ICD-10-CM | POA: Insufficient documentation

## 2012-10-01 DIAGNOSIS — S0990XA Unspecified injury of head, initial encounter: Secondary | ICD-10-CM | POA: Insufficient documentation

## 2012-10-01 DIAGNOSIS — Z905 Acquired absence of kidney: Secondary | ICD-10-CM | POA: Insufficient documentation

## 2012-10-01 DIAGNOSIS — Z7982 Long term (current) use of aspirin: Secondary | ICD-10-CM | POA: Insufficient documentation

## 2012-10-01 DIAGNOSIS — I509 Heart failure, unspecified: Secondary | ICD-10-CM | POA: Insufficient documentation

## 2012-10-01 DIAGNOSIS — R791 Abnormal coagulation profile: Secondary | ICD-10-CM | POA: Insufficient documentation

## 2012-10-01 DIAGNOSIS — E785 Hyperlipidemia, unspecified: Secondary | ICD-10-CM | POA: Insufficient documentation

## 2012-10-01 DIAGNOSIS — S0001XA Abrasion of scalp, initial encounter: Secondary | ICD-10-CM

## 2012-10-01 DIAGNOSIS — M25562 Pain in left knee: Secondary | ICD-10-CM

## 2012-10-01 DIAGNOSIS — IMO0002 Reserved for concepts with insufficient information to code with codable children: Secondary | ICD-10-CM | POA: Insufficient documentation

## 2012-10-01 DIAGNOSIS — Z8673 Personal history of transient ischemic attack (TIA), and cerebral infarction without residual deficits: Secondary | ICD-10-CM | POA: Insufficient documentation

## 2012-10-01 DIAGNOSIS — Y939 Activity, unspecified: Secondary | ICD-10-CM | POA: Insufficient documentation

## 2012-10-01 DIAGNOSIS — W19XXXA Unspecified fall, initial encounter: Secondary | ICD-10-CM | POA: Insufficient documentation

## 2012-10-01 DIAGNOSIS — M25569 Pain in unspecified knee: Secondary | ICD-10-CM | POA: Insufficient documentation

## 2012-10-01 LAB — POCT INR: INR: 3.3

## 2012-10-01 LAB — CBC
Hemoglobin: 11.3 g/dL — ABNORMAL LOW (ref 13.0–17.0)
Platelets: 128 10*3/uL — ABNORMAL LOW (ref 150–400)
RBC: 3.64 MIL/uL — ABNORMAL LOW (ref 4.22–5.81)
WBC: 6.5 10*3/uL (ref 4.0–10.5)

## 2012-10-01 LAB — PROTIME-INR
INR: 3.36 — ABNORMAL HIGH (ref 0.00–1.49)
Prothrombin Time: 32.8 seconds — ABNORMAL HIGH (ref 11.6–15.2)

## 2012-10-01 LAB — BASIC METABOLIC PANEL
CO2: 25 mEq/L (ref 19–32)
Calcium: 9.2 mg/dL (ref 8.4–10.5)
Chloride: 106 mEq/L (ref 96–112)
Glucose, Bld: 109 mg/dL — ABNORMAL HIGH (ref 70–99)
Potassium: 4.4 mEq/L (ref 3.5–5.1)
Sodium: 137 mEq/L (ref 135–145)

## 2012-10-01 MED ORDER — ACETAMINOPHEN 325 MG PO TABS
650.0000 mg | ORAL_TABLET | Freq: Once | ORAL | Status: AC
  Administered 2012-10-01: 650 mg via ORAL

## 2012-10-01 NOTE — Patient Instructions (Signed)
Hold Coumadin today. Recheck in 2 weeks. Anticoagulation Dose Instructions as of 10/01/2012     Glynis Smiles Tue Wed Thu Fri Sat   New Dose 5 mg 5 mg 5 mg 5 mg 5 mg 5 mg 5 mg    Description       Hold Coumadin today. Recheck in 2 weeks.

## 2012-10-01 NOTE — Telephone Encounter (Signed)
Patient needs a post hospital followup.Marland KitchenHowever we need to see him for INR in 2 weeks. Hold coumadin today.

## 2012-10-01 NOTE — Telephone Encounter (Signed)
Left message with Padonda's instructions

## 2012-10-01 NOTE — ED Notes (Signed)
Called [facility] and left report on the voicemail for the nurse

## 2012-10-01 NOTE — ED Provider Notes (Signed)
History    CSN: [id number] Arrival date & time 10/01/12  0225  First MD Initiated Contact with Patient 10/01/12 0234     Chief Complaint  Patient presents with  . Head Laceration    HPI Patient presents after following at the nursing facility.  He complains of left knee pain as well as a laceration in his posterior head.  No active bleeding at this time.  Staff reports baseline mental status.  The patient is on Coumadin for history of pulmonary embolism.  He has no chest pain shortness breath.  He denies abdominal pain.  No hip pain.  He does report mild neck pain.  No weakness of his upper lower extremities.  His pain is mild in severity at this time.  No other complaints.   Past Medical History  Diagnosis Date  . Myocardial infarction   . Peptic ulcer disease   . Hyperlipidemia   . Hypertension   . Hx pulmonary embolism   . Anemia   . CHF (congestive heart failure)   . Deep vein thrombosis (DVT)   . Stroke   . Hypotension     reaction to coreg   Past Surgical History  Procedure Laterality Date  . Appendectomy    . Cataract extraction    . Gastrectomy    . Nephrectomy      benign kidney tumor  . Transluminal angioplasty     Family History  Problem Relation Age of Onset  . Diabetes Mother   . Stroke Mother   . Leukemia Sister   . Cirrhosis Son     s/p liver transplant   History  Substance Use Topics  . Smoking status: Never Smoker   . Smokeless tobacco: Not on file  . Alcohol Use: No    Review of Systems  All other systems reviewed and are negative.    Allergies  Morphine sulfate  Home Medications   Current Outpatient Rx  Name  Route  Sig  Dispense  Refill  . allopurinol (ZYLOPRIM) 300 MG tablet   Oral   Take 300 mg by mouth daily.           . Ascorbic Acid (VITAMIN C) 1000 MG tablet   Oral   Take 1,000 mg by mouth daily.           Marland Kitchen aspirin 81 MG tablet   Oral   Take 81 mg by mouth daily.           . metoprolol succinate  (TOPROL-XL) 25 MG 24 hr tablet   Oral   Take 12.5 mg by mouth daily.         . simvastatin (ZOCOR) 80 MG tablet   Oral   Take 80 mg by mouth at bedtime.         Marland Kitchen warfarin (COUMADIN) 5 MG tablet               . acetaminophen (TYLENOL) 650 MG CR tablet   Oral   Take 650 mg by mouth every 8 (eight) hours as needed.           . nitroGLYCERIN (NITROSTAT) 0.4 MG SL tablet   Sublingual   Place 0.4 mg under the tongue every 5 (five) minutes as needed.            BP 157/87  Pulse 84  Temp(Src) 98.1 F (36.7 C) (Oral)  SpO2 97% Physical Exam  Nursing note and vitals reviewed. Constitutional: He is oriented to person, place, and  time. He appears well-developed and well-nourished.  HENT:  Head: Normocephalic.  Posterior scalp demonstrates small abrasion with stigmata of prior bleeding.  There is no laceration to repair  Eyes: EOM are normal.  Neck: Neck supple.  Immobilized in cervical collar on arrival to the emergency department cervical and paracervical tenderness without step-off  Cardiovascular: Normal rate, regular rhythm, normal heart sounds and intact distal pulses.   Pulmonary/Chest: Effort normal and breath sounds normal. No respiratory distress.  Abdominal: Soft. He exhibits no distension. There is no tenderness.  Genitourinary: Rectum normal.  Musculoskeletal: Normal range of motion.  Mild tenderness and pain with range of motion of his left knee.  Normal range of motion of his bilateral ankles and bilateral hips.  Normal range of motion of his right knee  Neurological: He is alert and oriented to person, place, and time.  Skin: Skin is warm and dry.  Psychiatric: He has a normal mood and affect. Judgment normal.    ED Course  Procedures (including critical care time) Labs Reviewed  CBC - Abnormal; Notable for the following:    RBC 3.64 (*)    Hemoglobin 11.3 (*)    HCT 34.1 (*)    Platelets 128 (*)    All other components within normal limits  BASIC  METABOLIC PANEL - Abnormal; Notable for the following:    Glucose, Bld 109 (*)    BUN 27 (*)    Creatinine, Ser 1.60 (*)    GFR calc non Af Amer 36 (*)    GFR calc Af Amer 42 (*)    All other components within normal limits  PROTIME-INR - Abnormal; Notable for the following:    Prothrombin Time 32.8 (*)    INR 3.36 (*)    All other components within normal limits   Ct Head Wo Contrast  10/01/2012   *RADIOLOGY REPORT*  Clinical Data:  Fall.  Head trauma.  Neck trauma.  CT HEAD WITHOUT CONTRAST CT CERVICAL SPINE WITHOUT CONTRAST  Technique:  Multidetector CT imaging of the head and cervical spine was performed following the standard protocol without intravenous contrast.  Multiplanar CT image reconstructions of the cervical spine were also generated.  Comparison:   None  CT HEAD  Findings: Scout images demonstrate a levoconvex torticollis which may be positional.  Vertebrobasilar dolichoectasia. No mass lesion, mass effect, midline shift, hydrocephalus, hemorrhage.  No acute territorial cortical ischemia/infarct. Atrophy and chronic ischemic white matter disease is present.  Calvarium intact.  IMPRESSION: Atrophy and chronic ischemic white matter disease without acute intracranial abnormality.  CT CERVICAL SPINE  Findings: Severe left temporomandibular joint osteoarthritis. Cervical spinal alignment is anatomic.  Craniocervical alignment is within normal limits.  Occipital condyles and odontoid intact. There is multilevel degenerative disc and facet disease.  No cervical spine fracture.  IMPRESSION: No acute abnormality.  Levoconvex torticollis which may be positional or associated with spasm.   Original Report Authenticated By: Andreas Newport, M.D.   Ct Cervical Spine Wo Contrast  10/01/2012   *RADIOLOGY REPORT*  Clinical Data:  Fall.  Head trauma.  Neck trauma.  CT HEAD WITHOUT CONTRAST CT CERVICAL SPINE WITHOUT CONTRAST  Technique:  Multidetector CT imaging of the head and cervical spine was  performed following the standard protocol without intravenous contrast.  Multiplanar CT image reconstructions of the cervical spine were also generated.  Comparison:   None  CT HEAD  Findings: Scout images demonstrate a levoconvex torticollis which may be positional.  Vertebrobasilar dolichoectasia. No mass lesion, mass effect,  midline shift, hydrocephalus, hemorrhage.  No acute territorial cortical ischemia/infarct. Atrophy and chronic ischemic white matter disease is present.  Calvarium intact.  IMPRESSION: Atrophy and chronic ischemic white matter disease without acute intracranial abnormality.  CT CERVICAL SPINE  Findings: Severe left temporomandibular joint osteoarthritis. Cervical spinal alignment is anatomic.  Craniocervical alignment is within normal limits.  Occipital condyles and odontoid intact. There is multilevel degenerative disc and facet disease.  No cervical spine fracture.  IMPRESSION: No acute abnormality.  Levoconvex torticollis which may be positional or associated with spasm.   Original Report Authenticated By: Andreas Newport, M.D.   Dg Knee Complete 4 Views Left  10/01/2012   *RADIOLOGY REPORT*  Clinical Data: Swelling and bruising.  Fall.  LEFT KNEE - COMPLETE 4+ VIEW  Comparison: None.  Findings: Patellar enthesopathy with moderate patellofemoral osteoarthritis.  No effusion.  Anatomic alignment of the knee. Medial compartment osteoarthritis with joint space narrowing. Minimal lateral compartment osteoarthritis.  Osteopenia.  No destructive osseous lesions.  Dense atherosclerosis.  Negative for fracture.  IMPRESSION: No acute osseous abnormality.  Medial and patellofemoral compartment predominant osteoarthritis of the knee.   Original Report Authenticated By: Andreas Newport, M.D.   I personally reviewed the imaging tests through PACS system I reviewed available ER/hospitalization records through the EMR   No diagnosis found.  MDM  CT head C-spine normal.  X-ray of his left knee  normal.  Discharge back to the nursing facility.  I last the patient hold his next dose of Coumadin.  Followup with his Coumadin team  Lyanne Co, MD 10/01/12 [phone]

## 2012-10-01 NOTE — ED Notes (Signed)
Pt arrived via [facility] from [facility] nursing facility. C/o left knee pain and LAC to back of Head. Bleeding controlled. Pt on blood thinners. EMS VS BP 130/90, HR 68, RR 16. Pt on blood thinner

## 2012-10-02 ENCOUNTER — Telehealth: Payer: Self-pay

## 2012-10-02 NOTE — Telephone Encounter (Signed)
Spoke with Med Tech and she is unsure of why the results were faxed 2 weeks after collection.   Order faxed to draw pt/inr 10/02/12

## 2012-10-02 NOTE — Telephone Encounter (Signed)
Left message for [facility] med tech to return call concerning pt's pt/inr results that were faxed on 10/02/12 but collected on 09/20/12 and resulted on 09/21/12

## 2012-10-04 LAB — PROTIME-INR

## 2012-10-08 ENCOUNTER — Ambulatory Visit (INDEPENDENT_AMBULATORY_CARE_PROVIDER_SITE_OTHER): Payer: Medicare Other | Admitting: General Practice

## 2012-10-08 DIAGNOSIS — I2699 Other pulmonary embolism without acute cor pulmonale: Secondary | ICD-10-CM

## 2012-10-08 LAB — PROTIME-INR: INR: 4.5 — AB (ref <=1.1)

## 2012-10-15 ENCOUNTER — Encounter: Payer: Self-pay | Admitting: Internal Medicine

## 2012-12-07 ENCOUNTER — Telehealth: Payer: Self-pay | Admitting: General Practice

## 2012-12-07 NOTE — Telephone Encounter (Signed)
Faxed order to [facility] to have RN check patient's INR on Monday 9/8 and to call results to Bailey Mech, RN @ [phone].

## 2012-12-10 ENCOUNTER — Telehealth: Payer: Self-pay | Admitting: Internal Medicine

## 2012-12-10 NOTE — Telephone Encounter (Signed)
[facility] cannot do pt's I &r today due to their lab only does stat labs for in house docs. Will be done Thurs. Pls send another order for thurs.

## 2012-12-10 NOTE — Telephone Encounter (Signed)
See below

## 2012-12-11 ENCOUNTER — Telehealth: Payer: Self-pay | Admitting: General Practice

## 2012-12-11 NOTE — Telephone Encounter (Signed)
Faxed orders to Nursing staff at [facility] to check patient's INR on Thursday, 9/11 and call results to Bailey Mech, RN

## 2013-01-11 ENCOUNTER — Telehealth: Payer: Self-pay | Admitting: General Practice

## 2013-01-11 NOTE — Telephone Encounter (Signed)
Faxed order to have patient's INR checked.

## 2013-01-17 LAB — POCT INR: INR: 2.4

## 2013-01-18 ENCOUNTER — Ambulatory Visit (INDEPENDENT_AMBULATORY_CARE_PROVIDER_SITE_OTHER): Payer: Medicare Other | Admitting: General Practice

## 2013-01-18 DIAGNOSIS — I2699 Other pulmonary embolism without acute cor pulmonale: Secondary | ICD-10-CM

## 2013-01-21 ENCOUNTER — Ambulatory Visit (INDEPENDENT_AMBULATORY_CARE_PROVIDER_SITE_OTHER): Payer: Medicare Other | Admitting: Family

## 2013-01-21 DIAGNOSIS — I2699 Other pulmonary embolism without acute cor pulmonale: Secondary | ICD-10-CM

## 2013-01-21 NOTE — Patient Instructions (Signed)
Continue to take 1 tablet all days except 1/2 tablet on Mon/Fri.  Re-check in 4 weeks.  Faxed instructions to facility @ [phone].

## 2013-02-15 ENCOUNTER — Ambulatory Visit (INDEPENDENT_AMBULATORY_CARE_PROVIDER_SITE_OTHER): Payer: Medicare Other | Admitting: General Practice

## 2013-02-15 DIAGNOSIS — I2699 Other pulmonary embolism without acute cor pulmonale: Secondary | ICD-10-CM

## 2013-04-26 LAB — POCT INR: INR: 2.7

## 2013-04-30 ENCOUNTER — Ambulatory Visit (INDEPENDENT_AMBULATORY_CARE_PROVIDER_SITE_OTHER): Payer: Medicare Other | Admitting: General Practice

## 2013-04-30 DIAGNOSIS — I2699 Other pulmonary embolism without acute cor pulmonale: Secondary | ICD-10-CM

## 2013-04-30 DIAGNOSIS — Z5181 Encounter for therapeutic drug level monitoring: Secondary | ICD-10-CM | POA: Insufficient documentation

## 2013-04-30 NOTE — Progress Notes (Signed)
Pre-visit discussion using our clinic review tool. No additional management support is needed unless otherwise documented below in the visit note.  

## 2013-06-18 ENCOUNTER — Telehealth: Payer: Self-pay | Admitting: General Practice

## 2013-06-18 NOTE — Telephone Encounter (Signed)
Faxed new orders to check PT to [facility] [phone]).

## 2013-06-27 ENCOUNTER — Telehealth: Payer: Self-pay | Admitting: General Practice

## 2013-06-27 NOTE — Telephone Encounter (Signed)
Faxed new orders to [facility] to check patient's INR on Thursday, 07/04/2013.

## 2013-07-04 LAB — PROTIME-INR
INR: 2.1 — AB (ref <=1.1)
INR: 2.1 — AB (ref <=1.1)

## 2013-07-07 ENCOUNTER — Emergency Department (HOSPITAL_COMMUNITY): Payer: Medicare Other

## 2013-07-07 ENCOUNTER — Emergency Department (HOSPITAL_COMMUNITY)
Admission: EM | Admit: 2013-07-07 | Discharge: 2013-07-08 | Disposition: A | Discharge status: Home / Self Care | Payer: Medicare Other | Attending: Emergency Medicine | Admitting: Emergency Medicine

## 2013-07-07 ENCOUNTER — Encounter (HOSPITAL_COMMUNITY): Payer: Self-pay | Admitting: Emergency Medicine

## 2013-07-07 DIAGNOSIS — Z86711 Personal history of pulmonary embolism: Secondary | ICD-10-CM | POA: Insufficient documentation

## 2013-07-07 DIAGNOSIS — E785 Hyperlipidemia, unspecified: Secondary | ICD-10-CM | POA: Insufficient documentation

## 2013-07-07 DIAGNOSIS — Z7982 Long term (current) use of aspirin: Secondary | ICD-10-CM | POA: Insufficient documentation

## 2013-07-07 DIAGNOSIS — S0990XA Unspecified injury of head, initial encounter: Secondary | ICD-10-CM | POA: Insufficient documentation

## 2013-07-07 DIAGNOSIS — S79929A Unspecified injury of unspecified thigh, initial encounter: Secondary | ICD-10-CM

## 2013-07-07 DIAGNOSIS — S51011A Laceration without foreign body of right elbow, initial encounter: Secondary | ICD-10-CM

## 2013-07-07 DIAGNOSIS — R296 Repeated falls: Secondary | ICD-10-CM | POA: Insufficient documentation

## 2013-07-07 DIAGNOSIS — W19XXXA Unspecified fall, initial encounter: Secondary | ICD-10-CM

## 2013-07-07 DIAGNOSIS — Z79899 Other long term (current) drug therapy: Secondary | ICD-10-CM | POA: Insufficient documentation

## 2013-07-07 DIAGNOSIS — Z7901 Long term (current) use of anticoagulants: Secondary | ICD-10-CM | POA: Insufficient documentation

## 2013-07-07 DIAGNOSIS — I10 Essential (primary) hypertension: Secondary | ICD-10-CM | POA: Insufficient documentation

## 2013-07-07 DIAGNOSIS — S79919A Unspecified injury of unspecified hip, initial encounter: Secondary | ICD-10-CM | POA: Insufficient documentation

## 2013-07-07 DIAGNOSIS — Y9210 Unspecified residential institution as the place of occurrence of the external cause: Secondary | ICD-10-CM | POA: Insufficient documentation

## 2013-07-07 DIAGNOSIS — Y9389 Activity, other specified: Secondary | ICD-10-CM | POA: Insufficient documentation

## 2013-07-07 DIAGNOSIS — S51009A Unspecified open wound of unspecified elbow, initial encounter: Secondary | ICD-10-CM | POA: Insufficient documentation

## 2013-07-07 DIAGNOSIS — I509 Heart failure, unspecified: Secondary | ICD-10-CM | POA: Insufficient documentation

## 2013-07-07 DIAGNOSIS — Z8673 Personal history of transient ischemic attack (TIA), and cerebral infarction without residual deficits: Secondary | ICD-10-CM | POA: Insufficient documentation

## 2013-07-07 DIAGNOSIS — Z8711 Personal history of peptic ulcer disease: Secondary | ICD-10-CM | POA: Insufficient documentation

## 2013-07-07 DIAGNOSIS — Z86718 Personal history of other venous thrombosis and embolism: Secondary | ICD-10-CM | POA: Insufficient documentation

## 2013-07-07 DIAGNOSIS — I252 Old myocardial infarction: Secondary | ICD-10-CM | POA: Insufficient documentation

## 2013-07-07 DIAGNOSIS — Z862 Personal history of diseases of the blood and blood-forming organs and certain disorders involving the immune mechanism: Secondary | ICD-10-CM | POA: Insufficient documentation

## 2013-07-07 LAB — BASIC METABOLIC PANEL
BUN: 33 mg/dL — AB (ref 6–23)
CALCIUM: 9.3 mg/dL (ref 8.4–10.5)
CO2: 22 mEq/L (ref 19–32)
Chloride: 104 mEq/L (ref 96–112)
Creatinine, Ser: 1.45 mg/dL — ABNORMAL HIGH (ref 0.50–1.35)
GFR, EST AFRICAN AMERICAN: 47 mL/min — AB (ref >90)
GFR, EST NON AFRICAN AMERICAN: 40 mL/min — AB (ref >90)
Glucose, Bld: 135 mg/dL — ABNORMAL HIGH (ref 70–99)
POTASSIUM: 5 meq/L (ref 3.7–5.3)
Sodium: 139 mEq/L (ref 137–147)

## 2013-07-07 LAB — CBC WITH DIFFERENTIAL/PLATELET
BASOS PCT: 0 % (ref 0–1)
Basophils Absolute: 0 10*3/uL (ref 0.0–0.1)
EOS ABS: 0.2 10*3/uL (ref 0.0–0.7)
EOS PCT: 3 % (ref 0–5)
HCT: 34.3 % — ABNORMAL LOW (ref 39.0–52.0)
HEMOGLOBIN: 11.1 g/dL — AB (ref 13.0–17.0)
Lymphocytes Relative: 18 % (ref 12–46)
Lymphs Abs: 1.1 10*3/uL (ref 0.7–4.0)
MCH: 29.9 pg (ref 26.0–34.0)
MCHC: 32.4 g/dL (ref 30.0–36.0)
MCV: 92.5 fL (ref 78.0–100.0)
MONOS PCT: 12 % (ref 3–12)
Monocytes Absolute: 0.8 10*3/uL (ref 0.1–1.0)
NEUTROS PCT: 67 % (ref 43–77)
Neutro Abs: 4.1 10*3/uL (ref 1.7–7.7)
PLATELETS: 148 10*3/uL — AB (ref 150–400)
RBC: 3.71 MIL/uL — ABNORMAL LOW (ref 4.22–5.81)
RDW: 15.8 % — ABNORMAL HIGH (ref 11.5–15.5)
WBC: 6.2 10*3/uL (ref 4.0–10.5)

## 2013-07-07 LAB — PROTIME-INR
INR: 2.44 — ABNORMAL HIGH (ref 0.00–1.49)
Prothrombin Time: 25.7 seconds — ABNORMAL HIGH (ref 11.6–15.2)

## 2013-07-07 NOTE — ED Provider Notes (Signed)
CSN: [id number]     Arrival date & time 07/07/13  2019 History   First MD Initiated Contact with Patient 07/07/13 2037     Chief Complaint  Patient presents with  . Fall     (Consider location/radiation/quality/duration/timing/severity/associated sxs/prior Treatment) HPI Comments: 80 year old male who fell trying to transfer from wheelchair to a recliner. He struck the back of his head, but denies loss of consciousness.  Patient is a 80 y.o. male presenting with fall.  Fall This is a new problem. Episode onset: Just prior to arrival. Episode frequency: Once. The problem has been resolved. Associated symptoms include headaches. Pertinent negatives include no chest pain, no abdominal pain and no shortness of breath. Nothing aggravates the symptoms. Nothing relieves the symptoms.    Past Medical History  Diagnosis Date  . Myocardial infarction   . Peptic ulcer disease   . Hyperlipidemia   . Hypertension   . Hx pulmonary embolism   . Anemia   . CHF (congestive heart failure)   . Deep vein thrombosis (DVT)   . Stroke   . Hypotension     reaction to coreg   Past Surgical History  Procedure Laterality Date  . Appendectomy    . Cataract extraction    . Gastrectomy    . Nephrectomy      benign kidney tumor  . Transluminal angioplasty     Family History  Problem Relation Age of Onset  . Diabetes Mother   . Stroke Mother   . Leukemia Sister   . Cirrhosis Son     s/p liver transplant   History  Substance Use Topics  . Smoking status: Never Smoker   . Smokeless tobacco: Not on file  . Alcohol Use: No    Review of Systems  Respiratory: Negative for cough and shortness of breath.   Cardiovascular: Negative for chest pain.  Gastrointestinal: Negative for nausea, vomiting, abdominal pain and diarrhea.  Neurological: Positive for headaches.  All other systems reviewed and are negative.      Allergies  Morphine sulfate  Home Medications   Current Outpatient Rx   Name  Route  Sig  Dispense  Refill  . acetaminophen (TYLENOL) 650 MG CR tablet   Oral   Take 650 mg by mouth every 8 (eight) hours as needed for pain.          Marland Kitchen allopurinol (ZYLOPRIM) 300 MG tablet   Oral   Take 300 mg by mouth daily.           . Ascorbic Acid (VITAMIN C) 1000 MG tablet   Oral   Take 1,000 mg by mouth daily.           Marland Kitchen aspirin 81 MG chewable tablet   Oral   Chew 81 mg by mouth daily.         . metoprolol succinate (TOPROL-XL) 25 MG 24 hr tablet   Oral   Take 12.5 mg by mouth daily.         . nitroGLYCERIN (NITROSTAT) 0.4 MG SL tablet   Sublingual   Place 0.4 mg under the tongue every 5 (five) minutes as needed.           . sennosides-docusate sodium (SENOKOT-S) 8.6-50 MG tablet   Oral   Take 1 tablet by mouth every 12 (twelve) hours as needed for constipation.         . simvastatin (ZOCOR) 80 MG tablet   Oral   Take 40 mg by mouth daily.          Marland Kitchen  warfarin (COUMADIN) 5 MG tablet      2.5-5 mg daily. Take 2.5mg  on Tues & Fri, all other days including Sunday take 5mg .          BP 157/77  Pulse 73  Temp(Src) 97.6 F (36.4 C) (Oral)  Resp 18  SpO2 100% Physical Exam  Nursing note and vitals reviewed. Constitutional: He is oriented to person, place, and time. He appears well-developed and well-nourished. No distress.  HENT:  Head: Normocephalic and atraumatic. Head is without raccoon's eyes and without Battle's sign.  Nose: Nose normal.  Eyes: Conjunctivae and EOM are normal. Pupils are equal, round, and reactive to light. No scleral icterus.  Neck: No spinous process tenderness and no muscular tenderness present.  Cardiovascular: Normal rate, regular rhythm, normal heart sounds and intact distal pulses.   No murmur heard. Pulmonary/Chest: Effort normal and breath sounds normal. He has no rales. He exhibits no tenderness.  Abdominal: Soft. There is no tenderness. There is no rebound and no guarding.  Musculoskeletal: Normal  range of motion. He exhibits no edema.       Right elbow: He exhibits swelling and laceration (skin tear). He exhibits normal range of motion and no deformity. No tenderness found.       Right hip: He exhibits tenderness (Mild). He exhibits normal range of motion, no bony tenderness, no swelling, no crepitus and no deformity.       Thoracic back: He exhibits no tenderness and no bony tenderness.       Lumbar back: He exhibits no tenderness and no bony tenderness.  No evidence of trauma to extremities, except as noted.  2+ distal pulses.    Neurological: He is alert and oriented to person, place, and time.  Skin: Skin is warm and dry. No rash noted.  Psychiatric: He has a normal mood and affect.    ED Course  Procedures (including critical care time) Labs Review Labs Reviewed  CBC WITH DIFFERENTIAL - Abnormal; Notable for the following:    RBC 3.71 (*)    Hemoglobin 11.1 (*)    HCT 34.3 (*)    RDW 15.8 (*)    Platelets 148 (*)    All other components within normal limits  BASIC METABOLIC PANEL - Abnormal; Notable for the following:    Glucose, Bld 135 (*)    BUN 33 (*)    Creatinine, Ser 1.45 (*)    GFR calc non Af Amer 40 (*)    GFR calc Af Amer 47 (*)    All other components within normal limits  PROTIME-INR - Abnormal; Notable for the following:    Prothrombin Time 25.7 (*)    INR 2.44 (*)    All other components within normal limits  URINALYSIS, ROUTINE W REFLEX MICROSCOPIC   Imaging Review Dg Elbow Complete Right  07/07/2013   CLINICAL DATA:  Fall  EXAM: RIGHT ELBOW - COMPLETE 3+ VIEW  COMPARISON:  None.  FINDINGS: No acute fracture or dislocation. There is no joint effusion. Degenerative changes noted at the medial and lateral epicondyles. Vascular calcifications present within the antecubital fossa.  Soft tissue swelling with laceration present at the posterior elbow.  IMPRESSION: 1. Soft tissue swelling with abrasion/laceration at the posterior elbow. 2. No acute fracture  or dislocation.   Electronically Signed   By: Rise Mu M.D.   On: 07/07/2013 22:43   Dg Hip Complete Right  07/07/2013   CLINICAL DATA:  Status post fall ; right hip pain.  EXAM:  RIGHT HIP - COMPLETE 2+ VIEW  COMPARISON:  CT of the abdomen and pelvis performed 09/17/2008  FINDINGS: There is no evidence of fracture or dislocation. Both femoral heads are seated normally within their respective acetabula. The proximal right femur appears intact. Mild degenerative change is noted along the lower lumbar spine. The sacroiliac joints are unremarkable in appearance.  The visualized bowel gas pattern is grossly unremarkable in appearance. Scattered vascular calcifications are seen.  IMPRESSION: No evidence of fracture or dislocation.   Electronically Signed   By: Roanna Raider M.D.   On: 07/07/2013 22:41   Ct Head Wo Contrast  07/07/2013   CLINICAL DATA:  Fall  EXAM: CT HEAD WITHOUT CONTRAST  CT CERVICAL SPINE WITHOUT CONTRAST  TECHNIQUE: Multidetector CT imaging of the head and cervical spine was performed following the standard protocol without intravenous contrast. Multiplanar CT image reconstructions of the cervical spine were also generated.  COMPARISON:  10/01/2012  FINDINGS: CT HEAD FINDINGS  Ventricles and cisterns are within normal. There is age related atrophic change present. Chronic ischemic microvascular disease is present. Old lacunar infarct over the right cerebellum. There is no mass, mass effect, shift of midline structures or acute hemorrhage. There is no evidence of acute infarction. Stable prominence and calcified plaque involving the basilar artery. Remaining bones soft tissues are unchanged.  CT CERVICAL SPINE FINDINGS  Vertebral body alignment and heights are normal. There is mild spondylosis throughout the cervical spine. There is minimal disc space narrowing at the C5-6 and C6-7 levels. Several Schmorl's nodes are unchanged. Prevertebral soft tissues are within normal. The  atlantoaxial articulation is normal. There is no acute fracture or subluxation. Uncovertebral joint spurring and facet arthropathy is demonstrated. There are degenerative changes of the temporomandibular joints. The lung apices are within normal. There is mild heterogeneity the thyroid gland unchanged.  IMPRESSION: No acute intracranial findings.  Mild atrophy and chronic ischemic microvascular disease.  Mild to moderate spondylosis of the cervical spine with multilevel disc disease. No acute fracture or subluxation.   Electronically Signed   By: Elberta Fortis M.D.   On: 07/07/2013 22:52   Ct Cervical Spine Wo Contrast  07/07/2013   CLINICAL DATA:  Fall  EXAM: CT HEAD WITHOUT CONTRAST  CT CERVICAL SPINE WITHOUT CONTRAST  TECHNIQUE: Multidetector CT imaging of the head and cervical spine was performed following the standard protocol without intravenous contrast. Multiplanar CT image reconstructions of the cervical spine were also generated.  COMPARISON:  10/01/2012  FINDINGS: CT HEAD FINDINGS  Ventricles and cisterns are within normal. There is age related atrophic change present. Chronic ischemic microvascular disease is present. Old lacunar infarct over the right cerebellum. There is no mass, mass effect, shift of midline structures or acute hemorrhage. There is no evidence of acute infarction. Stable prominence and calcified plaque involving the basilar artery. Remaining bones soft tissues are unchanged.  CT CERVICAL SPINE FINDINGS  Vertebral body alignment and heights are normal. There is mild spondylosis throughout the cervical spine. There is minimal disc space narrowing at the C5-6 and C6-7 levels. Several Schmorl's nodes are unchanged. Prevertebral soft tissues are within normal. The atlantoaxial articulation is normal. There is no acute fracture or subluxation. Uncovertebral joint spurring and facet arthropathy is demonstrated. There are degenerative changes of the temporomandibular joints. The lung apices  are within normal. There is mild heterogeneity the thyroid gland unchanged.  IMPRESSION: No acute intracranial findings.  Mild atrophy and chronic ischemic microvascular disease.  Mild to moderate spondylosis of the  cervical spine with multilevel disc disease. No acute fracture or subluxation.   Electronically Signed   By: Elberta Fortisaniel  Boyle M.D.   On: 07/07/2013 22:52  All radiology studies independently viewed by me.      EKG Interpretation None      MDM   Final diagnoses:  Fall  Skin tear of right elbow without complication    80 year old male with mechanical fall. He struck the back of his head, but did not lose consciousness. He takes Coumadin for history of PE. CT of his head and neck were negative. Plain films of his elbow and hip were also negative. He had foul-smelling urine so will check a urinalysis.  UA negative for infection. Patient lives in an assisted living facility with his wife.  He appears appropriate for discharge back to his facility since they will be able to watch for mental status changes.    Merrie RoofJohn David Ashten Sarnowski III, MD 07/08/13 [phone]

## 2013-07-07 NOTE — ED Notes (Signed)
Pt removed from [facility], pt denies any pain along spine. C collar remains in place.

## 2013-07-07 NOTE — ED Notes (Signed)
Per ems, pt from [facility]. Was transferring from wheelchair to recliner and fell backwards and hit head. No obvious injury to head. Skin tear noted to right elbow. Denies pain in neck or back. Pt c/o R hip pain, no shortening or rotation noted. Pt has hx of dementia and is taking coumadin. Pt is Alert and oriented.

## 2013-07-07 NOTE — ED Notes (Signed)
Pt was able to correctly answer all orientation questions.

## 2013-07-08 LAB — URINE MICROSCOPIC-ADD ON

## 2013-07-08 LAB — URINALYSIS, ROUTINE W REFLEX MICROSCOPIC
BILIRUBIN URINE: NEGATIVE
GLUCOSE, UA: NEGATIVE mg/dL
HGB URINE DIPSTICK: NEGATIVE
KETONES UR: NEGATIVE mg/dL
Nitrite: NEGATIVE
PROTEIN: 100 mg/dL — AB
Specific Gravity, Urine: 1.022 (ref 1.005–1.030)
Urobilinogen, UA: 0.2 mg/dL (ref 0.0–1.0)
pH: 5.5 (ref 5.0–8.0)

## 2013-07-08 NOTE — ED Notes (Signed)
This RN attempted to [facility], with no answer any time. This RN made paramedics with [facility] aware that the pt needs to follow up with his doctor for a recheck.

## 2013-07-08 NOTE — Discharge Instructions (Signed)
Fall Prevention and Home Safety Falls cause injuries and can affect all age groups. It is possible to use preventive measures to significantly decrease the likelihood of falls. There are many simple measures which can make your home safer and prevent falls. OUTDOORS  Repair cracks and edges of walkways and driveways.  Remove high doorway thresholds.  Trim shrubbery on the main path into your home.  Have good outside lighting.  Clear walkways of tools, rocks, debris, and clutter.  Check that handrails are not broken and are securely fastened. Both sides of steps should have handrails.  Have leaves, snow, and ice cleared regularly.  Use sand or salt on walkways during winter months.  In the garage, clean up grease or oil spills. BATHROOM  Install night lights.  Install grab bars by the toilet and in the tub and shower.  Use non-skid mats or decals in the tub or shower.  Place a plastic non-slip stool in the shower to sit on, if needed.  Keep floors dry and clean up all water on the floor immediately.  Remove soap buildup in the tub or shower on a regular basis.  Secure bath mats with non-slip, double-sided rug tape.  Remove throw rugs and tripping hazards from the floors. BEDROOMS  Install night lights.  Make sure a bedside light is easy to reach.  Do not use oversized bedding.  Keep a telephone by your bedside.  Have a firm chair with side arms to use for getting dressed.  Remove throw rugs and tripping hazards from the floor. KITCHEN  Keep handles on pots and pans turned toward the center of the stove. Use back burners when possible.  Clean up spills quickly and allow time for drying.  Avoid walking on wet floors.  Avoid hot utensils and knives.  Position shelves so they are not too high or low.  Place commonly used objects within easy reach.  If necessary, use a sturdy step stool with a grab bar when reaching.  Keep electrical cables out of the  way.  Do not use floor polish or wax that makes floors slippery. If you must use wax, use non-skid floor wax.  Remove throw rugs and tripping hazards from the floor. STAIRWAYS  Never leave objects on stairs.  Place handrails on both sides of stairways and use them. Fix any loose handrails. Make sure handrails on both sides of the stairways are as long as the stairs.  Check carpeting to make sure it is firmly attached along stairs. Make repairs to worn or loose carpet promptly.  Avoid placing throw rugs at the top or bottom of stairways, or properly secure the rug with carpet tape to prevent slippage. Get rid of throw rugs, if possible.  Have an electrician put in a light switch at the top and bottom of the stairs. OTHER FALL PREVENTION TIPS  Wear low-heel or rubber-soled shoes that are supportive and fit well. Wear closed toe shoes.  When using a stepladder, make sure it is fully opened and both spreaders are firmly locked. Do not climb a closed stepladder.  Add color or contrast paint or tape to grab bars and handrails in your home. Place contrasting color strips on first and last steps.  Learn and use mobility aids as needed. Install an electrical emergency response system.  Turn on lights to avoid dark areas. Replace light bulbs that burn out immediately. Get light switches that glow.  Arrange furniture to create clear pathways. Keep furniture in the same place.  Firmly attach carpet with non-skid or double-sided tape.  Eliminate uneven floor surfaces.  Select a carpet pattern that does not visually hide the edge of steps.  Be aware of all pets. OTHER HOME SAFETY TIPS  Set the water temperature for 120 F (48.8 C).  Keep emergency numbers on or near the telephone.  Keep smoke detectors on every level of the home and near sleeping areas. Document Released: 03/11/2002 Document Revised: 09/20/2011 Document Reviewed: 06/10/2011 [facility] Patient Information 2014  [place], [place].  Skin Tear Care A skin tear is a wound in which the top layer of skin has peeled off. This is a common problem with aging because the skin becomes thinner and more fragile as a person gets older. In addition, some medicines, such as oral corticosteroids, can lead to skin thinning if taken for long periods of time.  A skin tear is often repaired with tape or skin adhesive strips. This keeps the skin that has been peeled off in contact with the healthier skin beneath. Depending on the location of the wound, a bandage (dressing) may be applied over the tape or skin adhesive strips. Sometimes, during the healing process, the skin turns black and dies. Even when this happens, the torn skin acts as a good dressing until the skin underneath gets healthier and repairs itself. HOME CARE INSTRUCTIONS   Change dressings once per day or as directed by your caregiver.  Gently clean the skin tear and the area around the tear using saline solution or mild soap and water.  Do not rub the injured skin dry. Let the area air dry.  Apply petroleum jelly or an antibiotic cream or ointment to keep the tear moist. This will help the wound heal. Do not allow a scab to form.  If the dressing sticks before the next dressing change, moisten it with warm soapy water and gently remove it.  Protect the injured skin until it has healed.  Only take over-the-counter or prescription medicines as directed by your caregiver.  Take showers or baths using warm soapy water. Apply a new dressing after the shower or bath.  Keep all follow-up appointments as directed by your caregiver.  SEEK IMMEDIATE MEDICAL CARE IF:   You have redness, swelling, or increasing pain in the skin tear.  You havepus coming from the skin tear.  You have chills.  You have a red streak that goes away from the skin tear.  You have a bad smell coming from the tear or dressing.  You have a fever or persistent symptoms for more  than 2 3 days.  You have a fever and your symptoms suddenly get worse. MAKE SURE YOU:  Understand these instructions.  Will watch this condition.  Will get help right away if your child is not doing well or gets worse. Document Released: 12/14/2000 Document Revised: 12/14/2011 Document Reviewed: 10/03/2011 [facility] Patient Information 2014 [place], [place].

## 2013-07-09 ENCOUNTER — Ambulatory Visit (INDEPENDENT_AMBULATORY_CARE_PROVIDER_SITE_OTHER): Payer: Medicare Other | Admitting: General Practice

## 2013-07-09 DIAGNOSIS — Z5181 Encounter for therapeutic drug level monitoring: Secondary | ICD-10-CM

## 2013-07-09 DIAGNOSIS — I2699 Other pulmonary embolism without acute cor pulmonale: Secondary | ICD-10-CM

## 2013-07-09 NOTE — Progress Notes (Signed)
Pre visit review using our clinic review tool, if applicable. No additional management support is needed unless otherwise documented below in the visit note. 

## 2013-08-08 LAB — PROTIME-INR: INR: 2.1 — AB (ref 0.9–1.1)

## 2013-08-14 ENCOUNTER — Ambulatory Visit (INDEPENDENT_AMBULATORY_CARE_PROVIDER_SITE_OTHER): Payer: Medicare Other | Admitting: Family

## 2013-08-14 DIAGNOSIS — I2699 Other pulmonary embolism without acute cor pulmonale: Secondary | ICD-10-CM

## 2013-08-14 DIAGNOSIS — Z5181 Encounter for therapeutic drug level monitoring: Secondary | ICD-10-CM

## 2013-08-14 LAB — POCT INR: INR: 2.1

## 2013-08-14 NOTE — Patient Instructions (Signed)
Continue to take 1 tablet all days except 1/2 tablet on Tues/Fri.  Re-check in 4 weeks.  [facility] @ [phone].  Anticoagulation Dose Instructions as of 08/14/2013     Glynis SmilesSun Mon Tue Wed Thu Fri Sat   New Dose 5 mg 2.5 mg 5 mg 5 mg 5 mg 2.5 mg 5 mg    Description       Continue to take 1 tablet all days except 1/2 tablet on Tues/Fri.  Re-check in 4 weeks.  [facility] @ [phone].

## 2013-08-30 ENCOUNTER — Encounter: Payer: Self-pay | Admitting: Internal Medicine

## 2013-09-25 ENCOUNTER — Telehealth: Payer: Self-pay | Admitting: General Practice

## 2013-09-25 NOTE — Telephone Encounter (Signed)
[facility] requesting pt/inr results and orders dating back to January 2015.  Sent requested information on 6/24.

## 2013-09-26 ENCOUNTER — Telehealth: Payer: Self-pay | Admitting: Internal Medicine

## 2013-09-26 NOTE — Telephone Encounter (Signed)
[facility] states they have faxed over an orders/results to receive pt's pti nr from jan 2015 to present, twice and have not gotten the information yet. Also states that they have not received the order from dr. Lovell SheehanJenkins for the pt's pti nr for this month at all. Fax as soon as possible [phone].

## 2013-09-27 ENCOUNTER — Telehealth: Payer: Self-pay | Admitting: General Practice

## 2013-09-27 NOTE — Telephone Encounter (Signed)
[facility] to fax INR results to [phone] attention Cindy.

## 2013-09-27 NOTE — Telephone Encounter (Signed)
F[facility] to check INR the week of June 29 and call results to Bailey Mechindy Boyd, RN @ [phone] and also to fax results to [phone].

## 2013-10-01 ENCOUNTER — Telehealth: Payer: Self-pay | Admitting: General Practice

## 2013-10-01 NOTE — Telephone Encounter (Signed)
[facility]  Dinesha left message for Bailey Mechindy Boyd, RN to return call.  Call returned, left message on VM.

## 2013-10-11 ENCOUNTER — Ambulatory Visit (INDEPENDENT_AMBULATORY_CARE_PROVIDER_SITE_OTHER): Payer: Medicare Other | Admitting: Family Medicine

## 2013-10-11 DIAGNOSIS — Z5181 Encounter for therapeutic drug level monitoring: Secondary | ICD-10-CM

## 2013-10-11 DIAGNOSIS — I2699 Other pulmonary embolism without acute cor pulmonale: Secondary | ICD-10-CM

## 2013-10-11 LAB — POCT INR: INR: 2.4

## 2013-11-12 ENCOUNTER — Ambulatory Visit (INDEPENDENT_AMBULATORY_CARE_PROVIDER_SITE_OTHER): Payer: Medicare Other | Admitting: Family Medicine

## 2013-11-12 DIAGNOSIS — Z5181 Encounter for therapeutic drug level monitoring: Secondary | ICD-10-CM

## 2013-11-12 DIAGNOSIS — I2699 Other pulmonary embolism without acute cor pulmonale: Secondary | ICD-10-CM

## 2013-11-12 LAB — POCT INR: INR: 2.2

## 2013-11-20 ENCOUNTER — Telehealth: Payer: Self-pay | Admitting: Internal Medicine

## 2013-11-20 NOTE — Telephone Encounter (Signed)
Dr. Hunter please see below 

## 2013-11-20 NOTE — Telephone Encounter (Signed)
Pt's daughter called to inquire about a md for her parents. Used to be w/ Dr Cato MulliganSwords.   [facility] and very [facility]. Pt is on coum. They rarely ever come in. Daughter states its too difficult to bring them in unless they really need. [place]. Daughter(Elizabeth Grulke) is a physician at [facility] [employer] [employer]. [facility]. She would like to know if you would take over their care in case they need someone. Since pt is on coum, also needs someone to sign orders. Will you accept pt?

## 2013-12-18 ENCOUNTER — Encounter (HOSPITAL_COMMUNITY): Payer: Self-pay | Admitting: Emergency Medicine

## 2013-12-18 ENCOUNTER — Emergency Department (HOSPITAL_COMMUNITY): Payer: Medicare Other

## 2013-12-18 ENCOUNTER — Emergency Department (HOSPITAL_COMMUNITY)
Admission: EM | Admit: 2013-12-18 | Discharge: 2013-12-18 | Disposition: A | Discharge status: Home / Self Care | Payer: Medicare Other | Attending: Emergency Medicine | Admitting: Emergency Medicine

## 2013-12-18 DIAGNOSIS — Z8711 Personal history of peptic ulcer disease: Secondary | ICD-10-CM | POA: Insufficient documentation

## 2013-12-18 DIAGNOSIS — R296 Repeated falls: Secondary | ICD-10-CM | POA: Diagnosis not present

## 2013-12-18 DIAGNOSIS — S99919A Unspecified injury of unspecified ankle, initial encounter: Principal | ICD-10-CM

## 2013-12-18 DIAGNOSIS — Z86718 Personal history of other venous thrombosis and embolism: Secondary | ICD-10-CM | POA: Diagnosis not present

## 2013-12-18 DIAGNOSIS — Z8673 Personal history of transient ischemic attack (TIA), and cerebral infarction without residual deficits: Secondary | ICD-10-CM | POA: Insufficient documentation

## 2013-12-18 DIAGNOSIS — Z862 Personal history of diseases of the blood and blood-forming organs and certain disorders involving the immune mechanism: Secondary | ICD-10-CM | POA: Insufficient documentation

## 2013-12-18 DIAGNOSIS — S99929A Unspecified injury of unspecified foot, initial encounter: Principal | ICD-10-CM

## 2013-12-18 DIAGNOSIS — I509 Heart failure, unspecified: Secondary | ICD-10-CM | POA: Diagnosis not present

## 2013-12-18 DIAGNOSIS — F0390 Unspecified dementia without behavioral disturbance: Secondary | ICD-10-CM | POA: Diagnosis not present

## 2013-12-18 DIAGNOSIS — S8990XA Unspecified injury of unspecified lower leg, initial encounter: Secondary | ICD-10-CM | POA: Diagnosis not present

## 2013-12-18 DIAGNOSIS — Y9210 Unspecified residential institution as the place of occurrence of the external cause: Secondary | ICD-10-CM | POA: Insufficient documentation

## 2013-12-18 DIAGNOSIS — Z86711 Personal history of pulmonary embolism: Secondary | ICD-10-CM | POA: Insufficient documentation

## 2013-12-18 DIAGNOSIS — Z7901 Long term (current) use of anticoagulants: Secondary | ICD-10-CM | POA: Diagnosis not present

## 2013-12-18 DIAGNOSIS — E785 Hyperlipidemia, unspecified: Secondary | ICD-10-CM | POA: Diagnosis not present

## 2013-12-18 DIAGNOSIS — I10 Essential (primary) hypertension: Secondary | ICD-10-CM | POA: Insufficient documentation

## 2013-12-18 DIAGNOSIS — M25561 Pain in right knee: Secondary | ICD-10-CM

## 2013-12-18 DIAGNOSIS — I252 Old myocardial infarction: Secondary | ICD-10-CM | POA: Diagnosis not present

## 2013-12-18 DIAGNOSIS — Z79899 Other long term (current) drug therapy: Secondary | ICD-10-CM | POA: Diagnosis not present

## 2013-12-18 DIAGNOSIS — Z7982 Long term (current) use of aspirin: Secondary | ICD-10-CM | POA: Insufficient documentation

## 2013-12-18 DIAGNOSIS — Y9389 Activity, other specified: Secondary | ICD-10-CM | POA: Diagnosis not present

## 2013-12-18 LAB — CBC WITH DIFFERENTIAL/PLATELET
Basophils Absolute: 0 10*3/uL (ref 0.0–0.1)
Basophils Relative: 0 % (ref 0–1)
Eosinophils Absolute: 0.1 10*3/uL (ref 0.0–0.7)
Eosinophils Relative: 1 % (ref 0–5)
HCT: 33.8 % — ABNORMAL LOW (ref 39.0–52.0)
Hemoglobin: 10.9 g/dL — ABNORMAL LOW (ref 13.0–17.0)
Lymphocytes Relative: 10 % — ABNORMAL LOW (ref 12–46)
Lymphs Abs: 0.8 10*3/uL (ref 0.7–4.0)
MCH: 29.5 pg (ref 26.0–34.0)
MCHC: 32.2 g/dL (ref 30.0–36.0)
MCV: 91.6 fL (ref 78.0–100.0)
Monocytes Absolute: 0.8 10*3/uL (ref 0.1–1.0)
Monocytes Relative: 9 % (ref 3–12)
Neutro Abs: 6.6 10*3/uL (ref 1.7–7.7)
Neutrophils Relative %: 80 % — ABNORMAL HIGH (ref 43–77)
Platelets: 161 10*3/uL (ref 150–400)
RBC: 3.69 MIL/uL — ABNORMAL LOW (ref 4.22–5.81)
RDW: 15.9 % — ABNORMAL HIGH (ref 11.5–15.5)
WBC: 8.3 10*3/uL (ref 4.0–10.5)

## 2013-12-18 LAB — PROTIME-INR
INR: 2.81 — ABNORMAL HIGH (ref 0.00–1.49)
Prothrombin Time: 29.6 seconds — ABNORMAL HIGH (ref 11.6–15.2)

## 2013-12-18 LAB — BASIC METABOLIC PANEL
Anion gap: 9 (ref 5–15)
BUN: 27 mg/dL — ABNORMAL HIGH (ref 6–23)
CO2: 23 mEq/L (ref 19–32)
Calcium: 9.6 mg/dL (ref 8.4–10.5)
Chloride: 106 mEq/L (ref 96–112)
Creatinine, Ser: 1.48 mg/dL — ABNORMAL HIGH (ref 0.50–1.35)
GFR calc Af Amer: 46 mL/min — ABNORMAL LOW (ref >90)
GFR calc non Af Amer: 39 mL/min — ABNORMAL LOW (ref >90)
Glucose, Bld: 113 mg/dL — ABNORMAL HIGH (ref 70–99)
Potassium: 4.6 mEq/L (ref 3.7–5.3)
Sodium: 138 mEq/L (ref 137–147)

## 2013-12-18 LAB — URINALYSIS, ROUTINE W REFLEX MICROSCOPIC
Bilirubin Urine: NEGATIVE
Glucose, UA: NEGATIVE mg/dL
Hgb urine dipstick: NEGATIVE
Ketones, ur: NEGATIVE mg/dL
Nitrite: NEGATIVE
Protein, ur: 100 mg/dL — AB
Specific Gravity, Urine: 1.015 (ref 1.005–1.030)
Urobilinogen, UA: 0.2 mg/dL (ref 0.0–1.0)
pH: 6.5 (ref 5.0–8.0)

## 2013-12-18 LAB — URINE MICROSCOPIC-ADD ON

## 2013-12-18 MED ORDER — FENTANYL CITRATE 0.05 MG/ML IJ SOLN
75.0000 ug | Freq: Once | INTRAMUSCULAR | Status: AC
  Administered 2013-12-18: 75 ug via INTRAVENOUS
  Filled 2013-12-18: qty 2

## 2013-12-18 MED ORDER — ONDANSETRON HCL 4 MG/2ML IJ SOLN
4.0000 mg | Freq: Once | INTRAMUSCULAR | Status: AC
  Administered 2013-12-18: 4 mg via INTRAVENOUS
  Filled 2013-12-18: qty 2

## 2013-12-18 NOTE — ED Notes (Signed)
Ambulated patient in hall. Patient stated he used a walker during ambulation. Walker provided for patient. With use of walker, patient seemed unsteady during ambulation and complained of right leg pain.

## 2013-12-18 NOTE — ED Notes (Signed)
Patient transported to X-ray 

## 2013-12-18 NOTE — ED Notes (Signed)
To ED from nursing home, unwitnesed fall from bed with right knee pain, ?LOC, hx of dementia, per staff, pt at baseline pta with abnormal gait, on Coumadin, VSS

## 2013-12-18 NOTE — Discharge Instructions (Signed)

## 2013-12-26 NOTE — ED Provider Notes (Signed)
CSN: [id number]     Arrival date & time 12/18/13  0827 History   First MD Initiated Contact with Patient 12/18/13 [phone]     Chief Complaint  Patient presents with  . Fall     (Consider location/radiation/quality/duration/timing/severity/associated sxs/prior Treatment) HPI  80 year old male sent from nursing home for evaluation after a fall. A witnessed. Apparently happened shortly before arrival. Patient is complaining of pain in his right knee. Denies any significant pain anywhere else. He has a history dementia, but answers simple questions appropriate. Does not think he lost consciousness. Denies any headache, neck or back pain. No chest pain or shortness of breath. No abdominal pain. No nausea or vomiting. He is on Coumadin.  Past Medical History  Diagnosis Date  . Myocardial infarction   . Peptic ulcer disease   . Hyperlipidemia   . Hypertension   . Hx pulmonary embolism   . Anemia   . CHF (congestive heart failure)   . Deep vein thrombosis (DVT)   . Stroke   . Hypotension     reaction to coreg   Past Surgical History  Procedure Laterality Date  . Appendectomy    . Cataract extraction    . Gastrectomy    . Nephrectomy      benign kidney tumor  . Transluminal angioplasty     Family History  Problem Relation Age of Onset  . Diabetes Mother   . Stroke Mother   . Leukemia Sister   . Cirrhosis Son     s/p liver transplant   History  Substance Use Topics  . Smoking status: Never Smoker   . Smokeless tobacco: Not on file  . Alcohol Use: No    Review of Systems  All systems reviewed and negative, other than as noted in HPI.   Allergies  Morphine sulfate  Home Medications   Prior to Admission medications   Medication Sig Start Date End Date Taking? Authorizing Provider  acetaminophen (TYLENOL) 325 MG tablet Take by mouth every 8 (eight) hours as needed.   Yes Historical Provider, MD  allopurinol (ZYLOPRIM) 300 MG tablet Take 300 mg by mouth daily.     Yes  Historical Provider, MD  Ascorbic Acid (VITAMIN C) 1000 MG tablet Take 1,000 mg by mouth daily.     Yes Historical Provider, MD  aspirin 81 MG chewable tablet Chew 81 mg by mouth daily.   Yes Historical Provider, MD  metoprolol tartrate (LOPRESSOR) 25 MG tablet Take 12.5 mg by mouth daily.   Yes Historical Provider, MD  nitroGLYCERIN (NITROSTAT) 0.4 MG SL tablet Place 0.4 mg under the tongue every 5 (five) minutes as needed.     Yes Historical Provider, MD  sennosides-docusate sodium (SENOKOT-S) 8.6-50 MG tablet Take 1 tablet by mouth every 12 (twelve) hours as needed for constipation.   Yes Historical Provider, MD  simvastatin (ZOCOR) 40 MG tablet Take 40 mg by mouth daily.   Yes Historical Provider, MD  warfarin (COUMADIN) 5 MG tablet 2.5-5 mg daily. Take 2.5mg  on Tues & Fri, all other days including Sunday take . 09/07/10  Yes Bruce Rexene Edison Swords, MD   BP 147/78  Pulse 74  Temp(Src) 97.6 F (36.4 C) (Oral)  Resp 11  SpO2 100% Physical Exam  Nursing note and vitals reviewed. Constitutional: He appears well-developed and well-nourished. No distress.  HENT:  Head: Normocephalic and atraumatic.  Eyes: Conjunctivae are normal. Right eye exhibits no discharge. Left eye exhibits no discharge.  Neck: Neck supple.  Cardiovascular: Normal rate,  regular rhythm and normal heart sounds.  Exam reveals no gallop and no friction rub.   No murmur heard. Pulmonary/Chest: Effort normal and breath sounds normal. No respiratory distress.  Abdominal: Soft. He exhibits no distension. There is no tenderness.  Musculoskeletal: He exhibits tenderness. He exhibits no edema.  Mild tenderness medial aspect of R knee. No deformity. No effusion. No bony tenderness of extremities elsewhere or apparent pain with ROM of large joints. No midline spinal tenderness.  Neurological: He is alert.  Skin: Skin is warm and dry.  Psychiatric: He has a normal mood and affect. His behavior is normal. Thought content normal.    ED  Course  Procedures (including critical care time) Labs Review Labs Reviewed  URINALYSIS, ROUTINE W REFLEX MICROSCOPIC - Abnormal; Notable for the following:    Protein, ur 100 (*)    Leukocytes, UA SMALL (*)    All other components within normal limits  PROTIME-INR - Abnormal; Notable for the following:    Prothrombin Time 29.6 (*)    INR 2.81 (*)    All other components within normal limits  CBC WITH DIFFERENTIAL - Abnormal; Notable for the following:    RBC 3.69 (*)    Hemoglobin 10.9 (*)    HCT 33.8 (*)    RDW 15.9 (*)    Neutrophils Relative % 80 (*)    Lymphocytes Relative 10 (*)    All other components within normal limits  BASIC METABOLIC PANEL - Abnormal; Notable for the following:    Glucose, Bld 113 (*)    BUN 27 (*)    Creatinine, Ser 1.48 (*)    GFR calc non Af Amer 39 (*)    GFR calc Af Amer 46 (*)    All other components within normal limits  URINE MICROSCOPIC-ADD ON    Imaging Review No results found.   EKG Interpretation   Date/Time:  Wednesday December 18 2013 08:50:23 EDT Ventricular Rate:  83 PR Interval:  205 QRS Duration: 89 QT Interval:  380 QTC Calculation: 446 R Axis:   -54 Text Interpretation:  Sinus rhythm Left anterior fascicular block Anterior  infarct, old Nonspecific T abnormalities, lateral leads ED PHYSICIAN  INTERPRETATION AVAILABLE IN [facility] Confirmed by TEST, Record  ([id number]) on 12/20/2013 9:40:31 AM      MDM   Final diagnoses:  Right knee pain    80yM with R knee pain after fall. Imaging neg. Consider referred pain from hip, but doubt. Able to actively passively range hip w/o apparent discomfort. On coumadin. Denies HA, but with dementia and unclear of exact circumstances, CT of head/cervical spine done. Negative as well. Ambulated with walker which he normally uses prior to DC.     Raeford Razor, MD 12/27/13 [phone]

## 2014-01-19 ENCOUNTER — Encounter (HOSPITAL_COMMUNITY): Payer: Self-pay | Admitting: Emergency Medicine

## 2014-01-19 ENCOUNTER — Emergency Department (HOSPITAL_COMMUNITY): Payer: Medicare Other

## 2014-01-19 ENCOUNTER — Emergency Department (HOSPITAL_COMMUNITY)
Admission: EM | Admit: 2014-01-19 | Discharge: 2014-01-19 | Disposition: A | Discharge status: Home / Self Care | Payer: Medicare Other | Attending: Emergency Medicine | Admitting: Emergency Medicine

## 2014-01-19 DIAGNOSIS — Z862 Personal history of diseases of the blood and blood-forming organs and certain disorders involving the immune mechanism: Secondary | ICD-10-CM | POA: Insufficient documentation

## 2014-01-19 DIAGNOSIS — X58XXXA Exposure to other specified factors, initial encounter: Secondary | ICD-10-CM | POA: Insufficient documentation

## 2014-01-19 DIAGNOSIS — Y9289 Other specified places as the place of occurrence of the external cause: Secondary | ICD-10-CM | POA: Diagnosis not present

## 2014-01-19 DIAGNOSIS — Z7982 Long term (current) use of aspirin: Secondary | ICD-10-CM | POA: Insufficient documentation

## 2014-01-19 DIAGNOSIS — Z79899 Other long term (current) drug therapy: Secondary | ICD-10-CM | POA: Insufficient documentation

## 2014-01-19 DIAGNOSIS — E785 Hyperlipidemia, unspecified: Secondary | ICD-10-CM | POA: Diagnosis not present

## 2014-01-19 DIAGNOSIS — Z8673 Personal history of transient ischemic attack (TIA), and cerebral infarction without residual deficits: Secondary | ICD-10-CM | POA: Insufficient documentation

## 2014-01-19 DIAGNOSIS — I10 Essential (primary) hypertension: Secondary | ICD-10-CM | POA: Insufficient documentation

## 2014-01-19 DIAGNOSIS — Z8711 Personal history of peptic ulcer disease: Secondary | ICD-10-CM | POA: Insufficient documentation

## 2014-01-19 DIAGNOSIS — Y9389 Activity, other specified: Secondary | ICD-10-CM | POA: Insufficient documentation

## 2014-01-19 DIAGNOSIS — Z86718 Personal history of other venous thrombosis and embolism: Secondary | ICD-10-CM | POA: Diagnosis not present

## 2014-01-19 DIAGNOSIS — Z86711 Personal history of pulmonary embolism: Secondary | ICD-10-CM | POA: Insufficient documentation

## 2014-01-19 DIAGNOSIS — I6782 Cerebral ischemia: Secondary | ICD-10-CM | POA: Insufficient documentation

## 2014-01-19 DIAGNOSIS — Z7901 Long term (current) use of anticoagulants: Secondary | ICD-10-CM

## 2014-01-19 DIAGNOSIS — S79911A Unspecified injury of right hip, initial encounter: Secondary | ICD-10-CM | POA: Insufficient documentation

## 2014-01-19 DIAGNOSIS — S50312A Abrasion of left elbow, initial encounter: Secondary | ICD-10-CM | POA: Diagnosis not present

## 2014-01-19 DIAGNOSIS — I252 Old myocardial infarction: Secondary | ICD-10-CM | POA: Insufficient documentation

## 2014-01-19 DIAGNOSIS — S50812A Abrasion of left forearm, initial encounter: Secondary | ICD-10-CM | POA: Insufficient documentation

## 2014-01-19 DIAGNOSIS — S6992XA Unspecified injury of left wrist, hand and finger(s), initial encounter: Secondary | ICD-10-CM | POA: Insufficient documentation

## 2014-01-19 DIAGNOSIS — T148XXA Other injury of unspecified body region, initial encounter: Secondary | ICD-10-CM

## 2014-01-19 DIAGNOSIS — I509 Heart failure, unspecified: Secondary | ICD-10-CM | POA: Insufficient documentation

## 2014-01-19 DIAGNOSIS — M25551 Pain in right hip: Secondary | ICD-10-CM

## 2014-01-19 LAB — BASIC METABOLIC PANEL
Anion gap: 11 (ref 5–15)
BUN: 26 mg/dL — ABNORMAL HIGH (ref 6–23)
CO2: 22 mEq/L (ref 19–32)
Calcium: 9.2 mg/dL (ref 8.4–10.5)
Chloride: 106 mEq/L (ref 96–112)
Creatinine, Ser: 1.41 mg/dL — ABNORMAL HIGH (ref 0.50–1.35)
GFR calc Af Amer: 48 mL/min — ABNORMAL LOW (ref >90)
GFR calc non Af Amer: 42 mL/min — ABNORMAL LOW (ref >90)
Glucose, Bld: 84 mg/dL (ref 70–99)
Potassium: 4.4 mEq/L (ref 3.7–5.3)
Sodium: 139 mEq/L (ref 137–147)

## 2014-01-19 LAB — CBC WITH DIFFERENTIAL/PLATELET
Basophils Absolute: 0 10*3/uL (ref 0.0–0.1)
Basophils Relative: 0 % (ref 0–1)
Eosinophils Absolute: 0.2 10*3/uL (ref 0.0–0.7)
Eosinophils Relative: 2 % (ref 0–5)
HCT: 33.2 % — ABNORMAL LOW (ref 39.0–52.0)
Hemoglobin: 10.7 g/dL — ABNORMAL LOW (ref 13.0–17.0)
Lymphocytes Relative: 15 % (ref 12–46)
Lymphs Abs: 1.1 10*3/uL (ref 0.7–4.0)
MCH: 29.5 pg (ref 26.0–34.0)
MCHC: 32.2 g/dL (ref 30.0–36.0)
MCV: 91.5 fL (ref 78.0–100.0)
Monocytes Absolute: 0.9 10*3/uL (ref 0.1–1.0)
Monocytes Relative: 12 % (ref 3–12)
Neutro Abs: 5.2 10*3/uL (ref 1.7–7.7)
Neutrophils Relative %: 71 % (ref 43–77)
Platelets: 159 10*3/uL (ref 150–400)
RBC: 3.63 MIL/uL — ABNORMAL LOW (ref 4.22–5.81)
RDW: 15.6 % — ABNORMAL HIGH (ref 11.5–15.5)
WBC: 7.4 10*3/uL (ref 4.0–10.5)

## 2014-01-19 LAB — URINALYSIS, ROUTINE W REFLEX MICROSCOPIC
Bilirubin Urine: NEGATIVE
Glucose, UA: NEGATIVE mg/dL
Hgb urine dipstick: NEGATIVE
Ketones, ur: NEGATIVE mg/dL
Nitrite: NEGATIVE
Protein, ur: 100 mg/dL — AB
Specific Gravity, Urine: 1.018 (ref 1.005–1.030)
Urobilinogen, UA: 1 mg/dL (ref 0.0–1.0)
pH: 5.5 (ref 5.0–8.0)

## 2014-01-19 LAB — PROTIME-INR
INR: 3.05 — ABNORMAL HIGH (ref 0.00–1.49)
Prothrombin Time: 31.8 seconds — ABNORMAL HIGH (ref 11.6–15.2)

## 2014-01-19 LAB — URINE MICROSCOPIC-ADD ON

## 2014-01-19 MED ORDER — DOCUSATE SODIUM 100 MG PO CAPS: 100.0000 mg | ORAL_CAPSULE | Freq: Two times a day (BID) | ORAL | Status: AC | PRN

## 2014-01-19 MED ORDER — OXYCODONE-ACETAMINOPHEN 5-325 MG PO TABS: 1.0000 | ORAL_TABLET | ORAL | Status: DC | PRN

## 2014-01-19 MED ORDER — OXYCODONE-ACETAMINOPHEN 5-325 MG PO TABS
2.0000 | ORAL_TABLET | Freq: Once | ORAL | Status: AC
  Administered 2014-01-19: 2 via ORAL
  Filled 2014-01-19: qty 2

## 2014-01-19 NOTE — ED Notes (Signed)
[facility] contacted @ 2001 kt

## 2014-01-19 NOTE — ED Notes (Signed)
Pt back on the floor.

## 2014-01-19 NOTE — ED Notes (Signed)
Pt off unit with xray 

## 2014-01-19 NOTE — ED Notes (Signed)
Offered pt urinal for urine sample

## 2014-01-19 NOTE — ED Notes (Signed)
Dr. Wentz at bedside. 

## 2014-01-19 NOTE — ED Notes (Signed)
Pt becoming agitated and refusing to let staff get his vital signs.

## 2014-01-19 NOTE — ED Provider Notes (Signed)
Reevaluation, after MRI, right hip. Images read as no fracture, but probable right gluteus muscle insertion tear. Patient was interviewed. He is not oriented to time. He is oriented to person and place.  I discussed the findings with his daughter, Dr. Cain SaupeElizabeth Eichinger. She states that he usually takes only Tylenol for pain. He lives in an assisted living facility and does not ambulate much. He is usually in his motorized wheelchair.  Plan: Rest, Tylenol as needed for pain, followup with PCP, as needed.  Flint MelterElliott L Gerron Guidotti, MD 01/19/14 [phone]

## 2014-01-19 NOTE — ED Notes (Signed)
[facility]

## 2014-01-19 NOTE — ED Provider Notes (Signed)
CSN: [id number]     Arrival date & time 01/19/14  1344 History   First MD Initiated Contact with Patient 01/19/14 1354     Chief Complaint  Patient presents with  . Leg Pain     (Consider location/radiation/quality/duration/timing/severity/associated sxs/prior Treatment) HPI  80yM with R hip pain. Pt with multiple recent falls. Previously refused EMS transport until son apparently convinced him today. W/o pain primarily in R hip. Can bear weight although with a lot of pain. Reports he uses a walker at baseline. Some skin tears to L forearm. Denies HA or neck pain. Not sure if he may have hit his head in any of the recent falls. On coumadin for recurrent PE/DVT. Denies CP or SOB. No acute visual changes. No acute numbness tingling or loss of strength.   Past Medical History  Diagnosis Date  . Myocardial infarction   . Peptic ulcer disease   . Hyperlipidemia   . Hypertension   . Hx pulmonary embolism   . Anemia   . CHF (congestive heart failure)   . Deep vein thrombosis (DVT)   . Stroke   . Hypotension     reaction to coreg   Past Surgical History  Procedure Laterality Date  . Appendectomy    . Cataract extraction    . Gastrectomy    . Nephrectomy      benign kidney tumor  . Transluminal angioplasty     Family History  Problem Relation Age of Onset  . Diabetes Mother   . Stroke Mother   . Leukemia Sister   . Cirrhosis Son     s/p liver transplant   History  Substance Use Topics  . Smoking status: Never Smoker   . Smokeless tobacco: Not on file  . Alcohol Use: No    Review of Systems  All systems reviewed and negative, other than as noted in HPI.   Allergies  Morphine sulfate  Home Medications   Prior to Admission medications   Medication Sig Start Date End Date Taking? Authorizing Provider  acetaminophen (TYLENOL) 325 MG tablet Take by mouth every 8 (eight) hours as needed.   Yes Historical Provider, MD  allopurinol (ZYLOPRIM) 300 MG tablet Take 300 mg  by mouth daily.     Yes Historical Provider, MD  Ascorbic Acid (VITAMIN C) 1000 MG tablet Take 1,000 mg by mouth daily.     Yes Historical Provider, MD  aspirin 81 MG chewable tablet Chew 81 mg by mouth daily.   Yes Historical Provider, MD  metoprolol tartrate (LOPRESSOR) 25 MG tablet Take 12.5 mg by mouth daily.   Yes Historical Provider, MD  sennosides-docusate sodium (SENOKOT-S) 8.6-50 MG tablet Take 1 tablet by mouth every 12 (twelve) hours as needed for constipation.   Yes Historical Provider, MD  simvastatin (ZOCOR) 40 MG tablet Take 40 mg by mouth daily.   Yes Historical Provider, MD  warfarin (COUMADIN) 5 MG tablet 2.5-5 mg daily. Take 2.5mg  on Tues & Fri, all other days including Sunday take 5mg . 09/07/10  Yes Bruce Romilda Garret, MD  nitroGLYCERIN (NITROSTAT) 0.4 MG SL tablet Place 0.4 mg under the tongue every 5 (five) minutes as needed.      Historical Provider, MD   BP 140/69  Pulse 74  Temp(Src) 97.7 F (36.5 C) (Oral)  Resp 18  SpO2 99% Physical Exam  Nursing note and vitals reviewed. Constitutional: He is oriented to person, place, and time. He appears well-developed and well-nourished. No distress.  HENT:  Head: Normocephalic  and atraumatic.  Eyes: Conjunctivae and EOM are normal. Pupils are equal, round, and reactive to light. Right eye exhibits no discharge. Left eye exhibits no discharge.  Neck: Neck supple.  Cardiovascular: Normal rate, regular rhythm and normal heart sounds.  Exam reveals no gallop and no friction rub.   No murmur heard. Pulmonary/Chest: Effort normal and breath sounds normal. No respiratory distress.  Abdominal: Soft. He exhibits no distension. There is no tenderness.  Musculoskeletal: He exhibits no edema and no tenderness.  Right lower extremity does not appear to be shortened. Perhaps some slight internal rotation as compared to the left. Tenderness to palpation along the lateral aspect of the right hip. No deformity appreciated. No overlying skin  changes. Significant pain with attempted range of motion of the right hip. Palpable DP pulse. TTP L wrist. Increaed pain with ROM. NVI distally. Subacute appearing skin tear to dorsal aspect L forearm and another more proximally closed to elbow. NO midline spinal tenderness.  Neurological: He is alert and oriented to person, place, and time. No cranial nerve deficit. He exhibits normal muscle tone. Coordination normal.  Awake and alert. Disoriented to time. Pleasant. Answers questions appropriately. Follows commands. Cranial nerves are intact. Strength is 5/5 bilateral upper extremities and left lower extremity. Strong dorsi and plantar flexion of the right foot. Limited assessment of the right hip secondary to pain.  Skin: Skin is warm and dry.  Psychiatric: He has a normal mood and affect. His behavior is normal. Thought content normal.    ED Course  Procedures (including critical care time) Labs Review Labs Reviewed  PROTIME-INR - Abnormal; Notable for the following:    Prothrombin Time 31.8 (*)    INR 3.05 (*)    All other components within normal limits  CBC WITH DIFFERENTIAL - Abnormal; Notable for the following:    RBC 3.63 (*)    Hemoglobin 10.7 (*)    HCT 33.2 (*)    RDW 15.6 (*)    All other components within normal limits  BASIC METABOLIC PANEL - Abnormal; Notable for the following:    BUN 26 (*)    Creatinine, Ser 1.41 (*)    GFR calc non Af Amer 42 (*)    GFR calc Af Amer 48 (*)    All other components within normal limits  URINALYSIS, ROUTINE W REFLEX MICROSCOPIC - Abnormal; Notable for the following:    Protein, ur 100 (*)    Leukocytes, UA SMALL (*)    All other components within normal limits  URINE CULTURE  URINE MICROSCOPIC-ADD ON    Imaging Review Dg Wrist Complete Left  01/19/2014   CLINICAL DATA:  Left wrist pain with supination. Frequent falls over the past week, the most recent yesterday.  EXAM: LEFT WRIST - COMPLETE 3+ VIEW  COMPARISON:  None.  FINDINGS:  Joint space narrowing and bony remodeling involving the radiocarpal joint, distal radial ulnar joint and first metacarpal/carpal joint. No fracture or dislocation seen. Diffuse osteopenia. Arterial calcifications.  IMPRESSION: Multi joint degenerative changes.  No acute abnormality.   Electronically Signed   By: Gordan PaymentSteve  Reid M.D.   On: 01/19/2014 15:44   Dg Hip Complete Right  01/19/2014   CLINICAL DATA:  Right upper thigh pain and right leg pain.  EXAM: RIGHT HIP - COMPLETE 2+ VIEW  COMPARISON:  None.  FINDINGS: The bones are diffusely osteopenic. There is no acute fracture or subluxation. There is no radio-opaque foreign body or soft tissue calcification.  IMPRESSION: 1. No acute  findings. 2. Osteopenia noted.   Electronically Signed   By: Signa Kellaylor  Stroud M.D.   On: 01/19/2014 15:43   Ct Head Wo Contrast  01/19/2014   CLINICAL DATA:  Multiple falls on Coumadin.  Confusion.  EXAM: CT HEAD WITHOUT CONTRAST  TECHNIQUE: Contiguous axial images were obtained from the base of the skull through the vertex without intravenous contrast.  COMPARISON:  12/18/2013 and 07/07/2013  FINDINGS: Examination demonstrates mild prominence of the ventricles, cisterns and CSF spaces unchanged and likely age related atrophy. There is chronic ischemic microvascular disease. There is no mass, mass effect, shift of midline structures or acute hemorrhage. There is no evidence to suggest acute infarction. There is stable prominence and moderate calcification involving the basilar artery. Moderate calcification over the proximal middle cerebral arteries. Remaining bones soft tissues are unremarkable.  IMPRESSION: No acute intracranial findings.  Age related atrophic change and chronic ischemic microvascular vascular disease.   Electronically Signed   By: Elberta Fortisaniel  Boyle M.D.   On: 01/19/2014 15:09   Ct Hip Right Wo Contrast  01/19/2014   CLINICAL DATA:  Recent falls, hip pain, normal plain films, subsequent evaluation  EXAM: CT OF THE  RIGHT HIP WITHOUT CONTRAST  TECHNIQUE: Multidetector CT imaging of the right hip was performed according to the standard protocol. Multiplanar CT image reconstructions were also generated.  COMPARISON:  01/19/2014  FINDINGS: Significant diffuse osteopenia. No fracture identified involving pubic rami, acetabulum, or femur on the right. No significant soft tissue abnormality. No joint effusion.  Atherosclerotic calcification femoral artery. Mild right hip arthritis.  IMPRESSION: No fracture identified. However there is diffuse osteopenia. The possibility of a radiographically occult fracture given the degree of osteopenia is a reasonable consideration. Therefore, MRI of the right hip joint and proximal right femur is recommended.   Electronically Signed   By: Esperanza Heiraymond  Rubner M.D.   On: 01/19/2014 16:46     EKG Interpretation None      EKG:  Rhythm: normal sinus Vent. rate 72 BPM PR interval 201 ms QRS duration 86 ms QT/QTc 387/423 ms q waves anteriorly and inferiorly, unchanged. ST segments: ns st changes   MDM   Final diagnoses:  Right hip pain  Multiple skin tears  Anticoagulated on Coumadin    80 year old male with right hip pain. Multiple recent falls. He does have significant pain with range of motion of his right hip. He neurovascular intact. Imaging including plain films and CT did not show any acute abnormality. He does continue to have significant pain though and given his degree of osteopenia, radiology is recommending MRI. This workup is otherwise in pretty unremarkable. He is on Coumadin for history of recurrent pulmonary emboli. He's been in a fall risk dating back several years. Per review of notes by PCP though it has been felt that risk of stopping anticoagulation outweighs his fall risk. UA with some WBC/leuks. No bacteria. Nitrite neg. Will send urine culture, but with no specific urinary complaints, abx deferred.    Raeford RazorStephen Giara Mcgaughey, MD 01/22/14 1006

## 2014-01-19 NOTE — ED Notes (Signed)
Patient transported to CT 

## 2014-01-19 NOTE — ED Notes (Signed)
Pt back on unit from xray

## 2014-01-19 NOTE — ED Notes (Addendum)
Per EMS, [facility]; on arrival, patient denied any chest pain; reported LUQ pain that was resolved already. Reported right upper thigh and hip pain. Son reported that increased falls last 7-8 days at least once a day; last fall yesterday. EMS had evaluated each fall, but patient denied transport. Son wanted him to be evaluated for hip/thigh pain and he agreed today.

## 2014-01-23 LAB — URINE CULTURE: Colony Count: >=100000

## 2014-01-24 NOTE — Progress Notes (Signed)
ED Antimicrobial Stewardship Positive Culture Follow Up   Adline PealsRobert N Greenspan is an 80 y.o. male who presented to [facility] on 01/19/2014 with a chief complaint of  Chief Complaint  Patient presents with  . Leg Pain    Recent Results (from the past 720 hour(s))  URINE CULTURE     Status: None   Collection Time    01/19/14  4:30 PM      Result Value Ref Range Status   Specimen Description URINE, RANDOM   Final   Special Requests ADD [id number]   Final   Culture  Setup Time     Final   Value: 01/20/2014 00:10     Performed at [facility]   Colony Count     Final   Value: >=100,000 COLONIES/ML     Performed at [facility]   Culture     Final   Value: ENTEROCOCCUS SPECIES     Performed at [facility]   Report Status 01/23/2014 FINAL   Final   Organism ID, Bacteria ENTEROCOCCUS SPECIES   Final    89y[facility] from [facility] with leg pain as result of multiple falls. No urinary symptoms reported.  Recommendation: Fax results to [facility]. Recommend repeat UA and urine culture.   ED Provider: Roxy Horsemanobert Browning, PA-C   Cleon DewDulaney, [place] 01/24/2014, 2:41 PM Infectious Diseases Pharmacist Phone# [phone]

## 2014-01-25 ENCOUNTER — Telehealth (HOSPITAL_BASED_OUTPATIENT_CLINIC_OR_DEPARTMENT_OTHER): Payer: Self-pay | Admitting: Emergency Medicine

## 2014-01-25 NOTE — Telephone Encounter (Addendum)
+  Urine culture, result called and faxed to [facility] [phone].

## 2014-01-29 ENCOUNTER — Telehealth: Payer: Self-pay | Admitting: Family

## 2014-01-29 NOTE — Telephone Encounter (Signed)
Needs PT/INR as soon as possible. Please schedule.

## 2014-01-29 NOTE — Telephone Encounter (Signed)
Spoke with patient daughter [name] she said they are the process of changing doctors. He will not be coming back here for PT/INR

## 2014-01-30 ENCOUNTER — Ambulatory Visit: Payer: Self-pay | Admitting: Family

## 2014-01-30 DIAGNOSIS — Z5181 Encounter for therapeutic drug level monitoring: Secondary | ICD-10-CM

## 2014-01-30 DIAGNOSIS — I2699 Other pulmonary embolism without acute cor pulmonale: Secondary | ICD-10-CM

## 2014-08-25 ENCOUNTER — Encounter (HOSPITAL_COMMUNITY): Payer: Self-pay | Admitting: Emergency Medicine

## 2014-08-25 ENCOUNTER — Emergency Department (HOSPITAL_COMMUNITY)
Admission: EM | Admit: 2014-08-25 | Discharge: 2014-08-25 | Disposition: A | Discharge status: Home / Self Care | Payer: Medicare Other | Attending: Emergency Medicine | Admitting: Emergency Medicine

## 2014-08-25 ENCOUNTER — Emergency Department (HOSPITAL_COMMUNITY): Payer: Medicare Other

## 2014-08-25 DIAGNOSIS — I129 Hypertensive chronic kidney disease with stage 1 through stage 4 chronic kidney disease, or unspecified chronic kidney disease: Secondary | ICD-10-CM | POA: Insufficient documentation

## 2014-08-25 DIAGNOSIS — I252 Old myocardial infarction: Secondary | ICD-10-CM | POA: Insufficient documentation

## 2014-08-25 DIAGNOSIS — E785 Hyperlipidemia, unspecified: Secondary | ICD-10-CM | POA: Insufficient documentation

## 2014-08-25 DIAGNOSIS — Z86711 Personal history of pulmonary embolism: Secondary | ICD-10-CM | POA: Insufficient documentation

## 2014-08-25 DIAGNOSIS — I509 Heart failure, unspecified: Secondary | ICD-10-CM | POA: Diagnosis not present

## 2014-08-25 DIAGNOSIS — D649 Anemia, unspecified: Secondary | ICD-10-CM | POA: Insufficient documentation

## 2014-08-25 DIAGNOSIS — Z7982 Long term (current) use of aspirin: Secondary | ICD-10-CM | POA: Diagnosis not present

## 2014-08-25 DIAGNOSIS — Z8711 Personal history of peptic ulcer disease: Secondary | ICD-10-CM | POA: Diagnosis not present

## 2014-08-25 DIAGNOSIS — Z8673 Personal history of transient ischemic attack (TIA), and cerebral infarction without residual deficits: Secondary | ICD-10-CM | POA: Insufficient documentation

## 2014-08-25 DIAGNOSIS — N189 Chronic kidney disease, unspecified: Secondary | ICD-10-CM | POA: Diagnosis not present

## 2014-08-25 DIAGNOSIS — Z86718 Personal history of other venous thrombosis and embolism: Secondary | ICD-10-CM | POA: Insufficient documentation

## 2014-08-25 DIAGNOSIS — Z043 Encounter for examination and observation following other accident: Secondary | ICD-10-CM | POA: Insufficient documentation

## 2014-08-25 DIAGNOSIS — Z79899 Other long term (current) drug therapy: Secondary | ICD-10-CM | POA: Insufficient documentation

## 2014-08-25 DIAGNOSIS — Z7901 Long term (current) use of anticoagulants: Secondary | ICD-10-CM | POA: Insufficient documentation

## 2014-08-25 DIAGNOSIS — W19XXXA Unspecified fall, initial encounter: Secondary | ICD-10-CM

## 2014-08-25 LAB — I-STAT CHEM 8, ED
BUN: 30 mg/dL — AB (ref 6–20)
CALCIUM ION: 1.33 mmol/L — AB (ref 1.13–1.30)
CHLORIDE: 106 mmol/L (ref 101–111)
CREATININE: 1.6 mg/dL — AB (ref 0.61–1.24)
GLUCOSE: 81 mg/dL (ref 65–99)
HCT: 41 % (ref 39.0–52.0)
HEMOGLOBIN: 13.9 g/dL (ref 13.0–17.0)
Potassium: 4.3 mmol/L (ref 3.5–5.1)
Sodium: 140 mmol/L (ref 135–145)
TCO2: 21 mmol/L (ref 0–100)

## 2014-08-25 NOTE — ED Provider Notes (Signed)
CSN: [id number]     Arrival date & time 08/25/14  1207 History   First MD Initiated Contact with Patient 08/25/14 1235     Chief Complaint  Patient presents with  . Fall     (Consider location/radiation/quality/duration/timing/severity/associated sxs/prior Treatment) HPI Comments: 81 year old male with history of lipids, high blood pressure, peptic ulcers, lives in [facility] nursing home presents after witnessed fall. Patient is transferring from chair to wheelchair and slid down for letting on his butt ox and right back. Patient denies any significant pain at this time, feels at baseline so for mild fatigue. No head injury. Patient is on warfarin.  Patient is a 81 y.o. male presenting with fall. The history is provided by the patient.  Fall Pertinent negatives include no chest pain, no abdominal pain, no headaches and no shortness of breath.    Past Medical History  Diagnosis Date  . Myocardial infarction   . Peptic ulcer disease   . Hyperlipidemia   . Hypertension   . Hx pulmonary embolism   . Anemia   . CHF (congestive heart failure)   . Deep vein thrombosis (DVT)   . Stroke   . Hypotension     reaction to coreg   Past Surgical History  Procedure Laterality Date  . Appendectomy    . Cataract extraction    . Gastrectomy    . Nephrectomy      benign kidney tumor  . Transluminal angioplasty     Family History  Problem Relation Age of Onset  . Diabetes Mother   . Stroke Mother   . Leukemia Sister   . Cirrhosis Son     s/p liver transplant   History  Substance Use Topics  . Smoking status: Never Smoker   . Smokeless tobacco: Not on file  . Alcohol Use: No    Review of Systems  Constitutional: Positive for fatigue. Negative for fever and chills.  HENT: Negative for congestion.   Eyes: Negative for visual disturbance.  Respiratory: Negative for shortness of breath.   Cardiovascular: Negative for chest pain.  Gastrointestinal: Negative for vomiting and  abdominal pain.  Genitourinary: Negative for dysuria and flank pain.  Musculoskeletal: Positive for gait problem (chronic). Negative for back pain, neck pain and neck stiffness.  Skin: Negative for rash.  Neurological: Negative for light-headedness and headaches.      Allergies  Morphine sulfate  Home Medications   Prior to Admission medications   Medication Sig Start Date End Date Taking? Authorizing Provider  acetaminophen (TYLENOL) 325 MG tablet Take 650 mg by mouth 3 (three) times daily as needed for mild pain.    Yes Historical Provider, MD  allopurinol (ZYLOPRIM) 300 MG tablet Take 300 mg by mouth daily with breakfast.    Yes Historical Provider, MD  Ascorbic Acid (VITAMIN C) 1000 MG tablet Take 1,000 mg by mouth daily with breakfast.    Yes Historical Provider, MD  aspirin 81 MG chewable tablet Chew 81 mg by mouth daily with breakfast.    Yes Historical Provider, MD  cholecalciferol (VITAMIN D) 1000 UNITS tablet Take 1,000 Units by mouth daily with breakfast.   Yes Historical Provider, MD  ferrous sulfate 325 (65 FE) MG tablet Take 325 mg by mouth daily with breakfast.   Yes Historical Provider, MD  metoprolol tartrate (LOPRESSOR) 25 MG tablet Take 12.5 mg by mouth daily with breakfast.    Yes Historical Provider, MD  nystatin (MYCOSTATIN/NYSTOP) 100000 UNIT/GM POWD Apply 1 g topically 3 (three) times  daily. Applies to groin three times a day   Yes Historical Provider, MD  sennosides-docusate sodium (SENOKOT-S) 8.6-50 MG tablet Take 1 tablet by mouth every 12 (twelve) hours as needed for constipation.   Yes Historical Provider, MD  simvastatin (ZOCOR) 40 MG tablet Take 40 mg by mouth every evening.    Yes Historical Provider, MD  warfarin (COUMADIN) 5 MG tablet Take 2.5-5 mg by mouth See admin instructions. Takes 2.5mg  on Mondays and Thursday Takes 5mg  on Tuesday, Wednesday, Friday, and Saturday, and Sunday 09/07/10  Yes Bruce Romilda GarretH Swords, MD  docusate sodium (COLACE) 100 MG capsule  Take 1 capsule (100 mg total) by mouth 2 (two) times daily as needed for mild constipation (while taking pain medication). Patient not taking: Reported on 08/25/2014 01/19/14   Raeford RazorStephen Kohut, MD  nitroGLYCERIN (NITROSTAT) 0.4 MG SL tablet Place 0.4 mg under the tongue every 5 (five) minutes as needed.      Historical Provider, MD   BP 164/90 mmHg  Pulse 69  Temp(Src) 97.5 F (36.4 C) (Oral)  Resp 18  SpO2 99% Physical Exam  Constitutional: He is oriented to person, place, and time. He appears well-developed and well-nourished.  HENT:  Head: Normocephalic and atraumatic.  Mild dry mucous membranes  Eyes: Conjunctivae are normal. Right eye exhibits no discharge. Left eye exhibits no discharge.  Neck: Normal range of motion. Neck supple. No tracheal deviation present.  Cardiovascular: Normal rate and regular rhythm.   Pulmonary/Chest: Effort normal and breath sounds normal.  Abdominal: Soft. He exhibits no distension. There is no tenderness. There is no guarding.  Musculoskeletal: He exhibits no edema.  Patient has full range of motion of shoulders, hips, knees ankles and elbows without effusion or tenderness. Equal leg lengths.no midline vertebral tenderness cervical lumbar thoracic region. Full range of motion head neck.  Neurological: He is alert and oriented to person, place, and time. No cranial nerve deficit or sensory deficit. GCS eye subscore is 4. GCS verbal subscore is 5. GCS motor subscore is 6.  Gen. weakness  Skin: Skin is warm. No rash noted.  Psychiatric: He has a normal mood and affect.  Nursing note and vitals reviewed.   ED Course  Procedures (including critical care time) Labs Review Labs Reviewed  I-STAT CHEM 8, ED - Abnormal; Notable for the following:    BUN 30 (*)    Creatinine, Ser 1.60 (*)    Calcium, Ion 1.33 (*)    All other components within normal limits    Imaging Review Dg Pelvis 1-2 Views  08/25/2014   CLINICAL DATA:  Patient from nursing home,  fell onto floor with redness right side back  EXAM: PELVIS - 1-2 VIEW  COMPARISON:  None.  FINDINGS: Diffuse osteopenia. Bilateral hip arthritis. Aortoiliac calcification. No evidence of pelvic bone or proximal femur fracture.  IMPRESSION: No acute findings   Electronically Signed   By: Esperanza Heiraymond  Rubner M.D.   On: 08/25/2014 14:09     EKG Interpretation None      MDM   Final diagnoses:  Fall  CRF (chronic renal failure), unspecified stage   Patient presents with low risk fall witnessed no head injury. Pelvic x-ray no acute fracture. Patient feels at baseline 7 mild fatigue, screening blood work no emergent findings. Patient stable to follow-up outpatient.  Results and differential diagnosis were discussed with the patient/parent/guardian. Close follow up outpatient was discussed, comfortable with the plan.   Medications - No data to display  Filed Vitals:   08/25/14  1220 08/25/14 1424  BP: 161/83 164/90  Pulse: 73 69  Temp: 97.5 F (36.4 C)   TempSrc: Oral   Resp: 16 18  SpO2: 96% 99%    Final diagnoses:  Fall  CRF (chronic renal failure), unspecified stage        Blane Ohara, MD 08/25/14 [phone]

## 2014-08-25 NOTE — ED Notes (Signed)
Bed: [id number] Expected date:  Expected time:  Means of arrival:  Comments: 81 y/o M fall

## 2014-08-25 NOTE — Discharge Instructions (Signed)
If you were given medicines take as directed.  If you are on coumadin or contraceptives realize their levels and effectiveness is altered by many different medicines.  If you have any reaction (rash, tongues swelling, other) to the medicines stop taking and see a physician.    If your blood pressure was elevated in the ER make sure you follow up for management with a primary doctor or return for chest pain, shortness of breath or stroke symptoms.  Please follow up as directed and return to the ER or see a physician for new or worsening symptoms.  Thank you. Filed Vitals:   08/25/14 1220 08/25/14 1424  BP: 161/83 164/90  Pulse: 73 69  Temp: 97.5 F (36.4 C)   TempSrc: Oral   Resp: 16 18  SpO2: 96% 99%

## 2014-08-25 NOTE — ED Notes (Signed)
Per EMS: Pt from [facility].  Pt had witnessed fall while transferring himself from chair to wheelchair and slid to the flood.  Minor redness noted to rt side of his back from sliding down the wheelchair,  No other obvious injury.  Pt denies spinal tenderness.  Passed [facility].  When asked if anything hurts, pt states "only my feelings".  Pt is AXO x 4.

## 2014-10-18 ENCOUNTER — Encounter (HOSPITAL_COMMUNITY): Payer: Self-pay | Admitting: *Deleted

## 2014-10-18 ENCOUNTER — Emergency Department (HOSPITAL_COMMUNITY)
Admission: EM | Admit: 2014-10-18 | Discharge: 2014-10-18 | Disposition: A | Discharge status: Home / Self Care | Payer: Medicare Other | Attending: Emergency Medicine | Admitting: Emergency Medicine

## 2014-10-18 DIAGNOSIS — I252 Old myocardial infarction: Secondary | ICD-10-CM | POA: Insufficient documentation

## 2014-10-18 DIAGNOSIS — Z86718 Personal history of other venous thrombosis and embolism: Secondary | ICD-10-CM | POA: Insufficient documentation

## 2014-10-18 DIAGNOSIS — Y998 Other external cause status: Secondary | ICD-10-CM | POA: Diagnosis not present

## 2014-10-18 DIAGNOSIS — Z7901 Long term (current) use of anticoagulants: Secondary | ICD-10-CM | POA: Diagnosis not present

## 2014-10-18 DIAGNOSIS — Z86711 Personal history of pulmonary embolism: Secondary | ICD-10-CM | POA: Insufficient documentation

## 2014-10-18 DIAGNOSIS — Z8711 Personal history of peptic ulcer disease: Secondary | ICD-10-CM | POA: Diagnosis not present

## 2014-10-18 DIAGNOSIS — I509 Heart failure, unspecified: Secondary | ICD-10-CM | POA: Diagnosis not present

## 2014-10-18 DIAGNOSIS — I10 Essential (primary) hypertension: Secondary | ICD-10-CM | POA: Insufficient documentation

## 2014-10-18 DIAGNOSIS — Y9389 Activity, other specified: Secondary | ICD-10-CM | POA: Insufficient documentation

## 2014-10-18 DIAGNOSIS — Z7982 Long term (current) use of aspirin: Secondary | ICD-10-CM | POA: Insufficient documentation

## 2014-10-18 DIAGNOSIS — W272XXA Contact with scissors, initial encounter: Secondary | ICD-10-CM | POA: Insufficient documentation

## 2014-10-18 DIAGNOSIS — Z23 Encounter for immunization: Secondary | ICD-10-CM | POA: Insufficient documentation

## 2014-10-18 DIAGNOSIS — D649 Anemia, unspecified: Secondary | ICD-10-CM | POA: Insufficient documentation

## 2014-10-18 DIAGNOSIS — S99922A Unspecified injury of left foot, initial encounter: Secondary | ICD-10-CM | POA: Diagnosis present

## 2014-10-18 DIAGNOSIS — E785 Hyperlipidemia, unspecified: Secondary | ICD-10-CM | POA: Diagnosis not present

## 2014-10-18 DIAGNOSIS — Z79899 Other long term (current) drug therapy: Secondary | ICD-10-CM | POA: Insufficient documentation

## 2014-10-18 DIAGNOSIS — Y9289 Other specified places as the place of occurrence of the external cause: Secondary | ICD-10-CM | POA: Diagnosis not present

## 2014-10-18 DIAGNOSIS — Z8673 Personal history of transient ischemic attack (TIA), and cerebral infarction without residual deficits: Secondary | ICD-10-CM | POA: Insufficient documentation

## 2014-10-18 MED ORDER — TETANUS-DIPHTH-ACELL PERTUSSIS 5-2.5-18.5 LF-MCG/0.5 IM SUSP
0.5000 mL | Freq: Once | INTRAMUSCULAR | Status: AC
  Administered 2014-10-18: 0.5 mL via INTRAMUSCULAR
  Filled 2014-10-18: qty 0.5

## 2014-10-18 NOTE — Discharge Instructions (Signed)
°  Nail Avulsion Injury °Nail avulsion means that you have lost the whole, or part of a nail. The nail will usually grow back in 2 to 6 months. If your injury damaged the growth center of the nail, the nail may be deformed, split, or not stuck to the nail bed. Sometimes the avulsed nail is stitched back in place. This provides temporary protection to the nail bed until the new nail grows in.  °HOME CARE INSTRUCTIONS  °· Raise (elevate) your injury as much as possible. °· Protect the injury and cover it with bandages (dressings) or splints as instructed. °· Change dressings as instructed. °SEEK MEDICAL CARE IF:  °· There is increasing pain, redness, or swelling. °· You cannot move your fingers or toes. °Document Released: 04/28/2004 Document Revised: 06/13/2011 Document Reviewed: 02/20/2009 °Exit[employer]® Patient Information ©2015 [employer]. This information is not intended to replace advice given to you by your health care provider. Make sure you discuss any questions you have with your health care provider. ° ° °

## 2014-10-18 NOTE — ED Notes (Signed)
Pt is on coumadin and he continues to bleed due to fact he was cutting his own toenails with scissors and cut it too short.  Pt has dementia

## 2014-10-18 NOTE — ED Notes (Signed)
[facility] Expected date:  Expected time:  Means of arrival:  Comments: EMS 38M toe injury

## 2014-10-18 NOTE — ED Provider Notes (Signed)
CSN: [id number]     Arrival date & time 10/18/14  1946 History   First MD Initiated Contact with Patient 10/18/14 1959     Chief Complaint  Patient presents with  . Toe Injury     (Consider location/radiation/quality/duration/timing/severity/associated sxs/prior Treatment) HPI Comments: Patient was clipping his toenails with scissors and shaved off the skin off the end of his toe.   Patient is a 81 y.o. male presenting with toe pain. The history is provided by the patient.  Toe Pain This is a new problem. The current episode started 1 to 2 hours ago. The problem occurs constantly. The problem has not changed since onset.Pertinent negatives include no chest pain, no abdominal pain and no shortness of breath. Nothing aggravates the symptoms. Nothing relieves the symptoms.    Past Medical History  Diagnosis Date  . Myocardial infarction   . Peptic ulcer disease   . Hyperlipidemia   . Hypertension   . Hx pulmonary embolism   . Anemia   . CHF (congestive heart failure)   . Deep vein thrombosis (DVT)   . Stroke   . Hypotension     reaction to coreg   Past Surgical History  Procedure Laterality Date  . Appendectomy    . Cataract extraction    . Gastrectomy    . Nephrectomy      benign kidney tumor  . Transluminal angioplasty     Family History  Problem Relation Age of Onset  . Diabetes Mother   . Stroke Mother   . Leukemia Sister   . Cirrhosis Son     s/p liver transplant   History  Substance Use Topics  . Smoking status: Never Smoker   . Smokeless tobacco: Not on file  . Alcohol Use: No    Review of Systems  Constitutional: Negative for fever.  Respiratory: Negative for cough and shortness of breath.   Cardiovascular: Negative for chest pain.  Gastrointestinal: Negative for abdominal pain.  All other systems reviewed and are negative.     Allergies  Morphine sulfate  Home Medications   Prior to Admission medications   Medication Sig Start Date End Date  Taking? Authorizing Provider  acetaminophen (TYLENOL) 325 MG tablet Take 650 mg by mouth 3 (three) times daily as needed for mild pain.    Yes Historical Provider, MD  allopurinol (ZYLOPRIM) 300 MG tablet Take 300 mg by mouth daily with breakfast.    Yes Historical Provider, MD  aspirin 81 MG chewable tablet Chew 81 mg by mouth daily with breakfast.    Yes Historical Provider, MD  ferrous sulfate 325 (65 FE) MG tablet Take 325 mg by mouth daily with breakfast.   Yes Historical Provider, MD  metoprolol tartrate (LOPRESSOR) 25 MG tablet Take 12.5 mg by mouth daily with breakfast.    Yes Historical Provider, MD  Ascorbic Acid (VITAMIN C) 1000 MG tablet Take 1,000 mg by mouth daily with breakfast.     Historical Provider, MD  cholecalciferol (VITAMIN D) 1000 UNITS tablet Take 1,000 Units by mouth daily with breakfast.    Historical Provider, MD  docusate sodium (COLACE) 100 MG capsule Take 1 capsule (100 mg total) by mouth 2 (two) times daily as needed for mild constipation (while taking pain medication). Patient not taking: Reported on 08/25/2014 01/19/14   Raeford RazorStephen Kohut, MD  nitroGLYCERIN (NITROSTAT) 0.4 MG SL tablet Place 0.4 mg under the tongue every 5 (five) minutes as needed.      Historical Provider, MD  nystatin (  MYCOSTATIN/NYSTOP) 100000 UNIT/GM POWD Apply 1 g topically 3 (three) times daily. Applies to groin three times a day    Historical Provider, MD  sennosides-docusate sodium (SENOKOT-S) 8.6-50 MG tablet Take 1 tablet by mouth every 12 (twelve) hours as needed for constipation.    Historical Provider, MD  simvastatin (ZOCOR) 40 MG tablet Take 40 mg by mouth every evening.     Historical Provider, MD  warfarin (COUMADIN) 5 MG tablet Take 2.5-5 mg by mouth See admin instructions. Takes 2.5mg  on Mondays and Thursday Takes  on Tuesday, Wednesday, Friday, and Saturday, and Sunday 09/07/10   Lindley Magnus, MD   BP 156/71 mmHg  Pulse 84  Temp(Src) 98.7 F (37.1 C) (Oral)  Resp 18  SpO2  96% Physical Exam  Constitutional: He is oriented to person, place, and time. He appears well-developed and well-nourished. No distress.  HENT:  Head: Normocephalic and atraumatic.  Mouth/Throat: No oropharyngeal exudate.  Eyes: EOM are normal. Pupils are equal, round, and reactive to light.  Neck: Normal range of motion. Neck supple.  Cardiovascular: Normal rate and regular rhythm.  Exam reveals no friction rub.   No murmur heard. Pulmonary/Chest: Effort normal and breath sounds normal. No respiratory distress. He has no wheezes. He has no rales.  Abdominal: He exhibits no distension. There is no tenderness. There is no rebound.  Musculoskeletal: Normal range of motion. He exhibits no edema.       Feet:  Neurological: He is alert and oriented to person, place, and time.  Skin: He is not diaphoretic.  Nursing note and vitals reviewed.   ED Course  Procedures (including critical care time) Labs Review Labs Reviewed - No data to display  Imaging Review No results found.   EKG Interpretation None      MDM   Final diagnoses:  Toe injury, left, initial encounter    54M here after he clipped the skin off the end of his toe. Patient was clipping his toenails with scissors and cut too close. L great toe with mild nail bed injury where he cut the lateral corner off with scissors. Tip of toe shaved off. No laceration amenable to repair. Dressing applied, tetanus updated. Discharged.    Elwin Mocha, MD 10/18/14 2231

## 2014-10-19 IMAGING — CT CT HEAD W/O CM
1 series · 16 of 30 positions shown, 20 images · non-contrast
Comparison: 12/18/2013 and 07/07/2013

CLINICAL DATA: Multiple falls on Coumadin.  Confusion.

EXAM:
CT HEAD WITHOUT CONTRAST
TECHNIQUE: Contiguous axial images were obtained from the base of the skull
through the vertex without intravenous contrast.

[Series 2: head 5.0 h30s · axial · 0.45mm/px · z∈[-95,+55]mm · 16 of 34 slices shown, 20 images]
[im 2/34  brain]
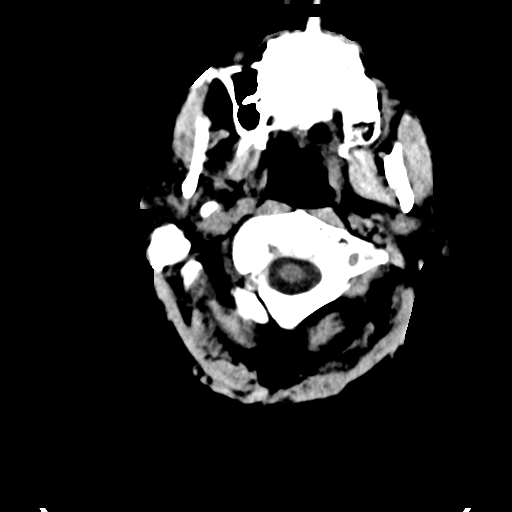
[im 2/34  bone]
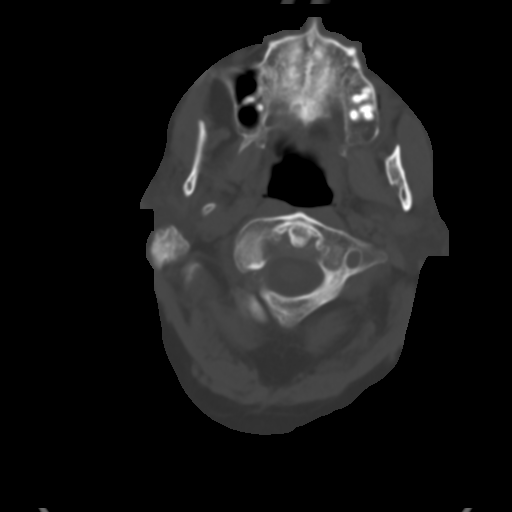
[im 4/34  brain]
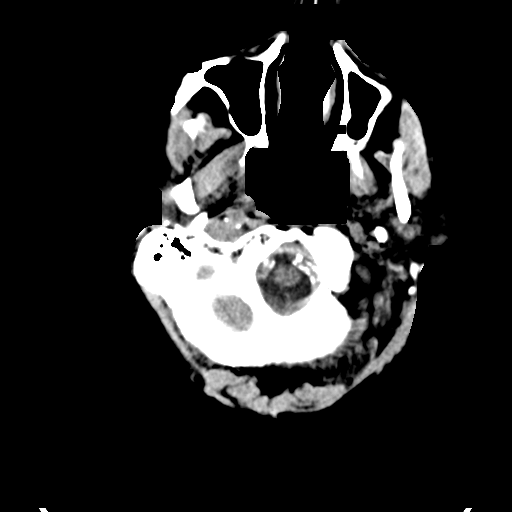
[im 6/34  brain]
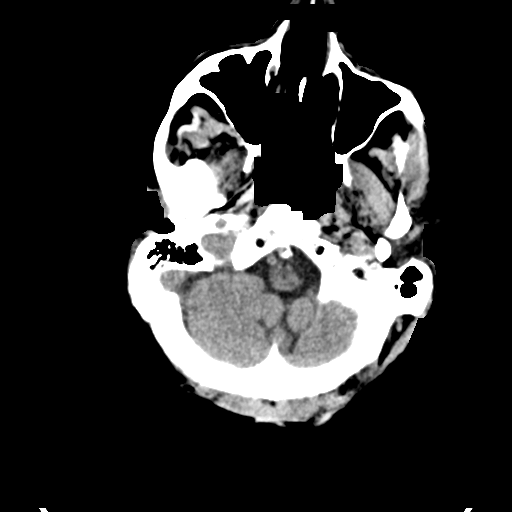
[im 8/34  brain]
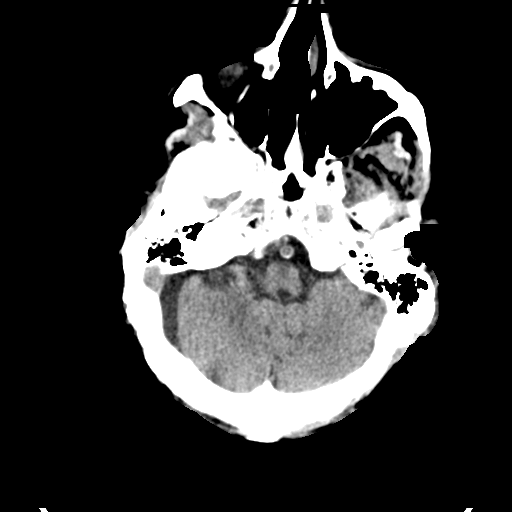
[im 10/34  brain]
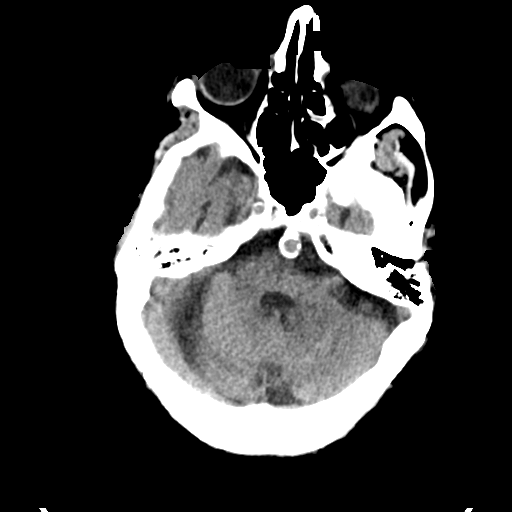
[im 10/34  bone]
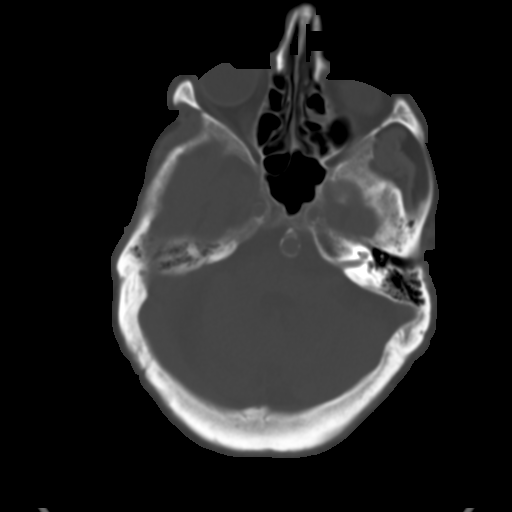
[im 12/34  brain]
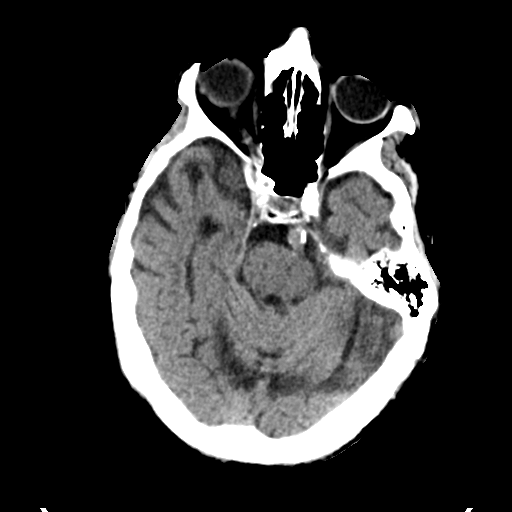
[im 14/34  brain]
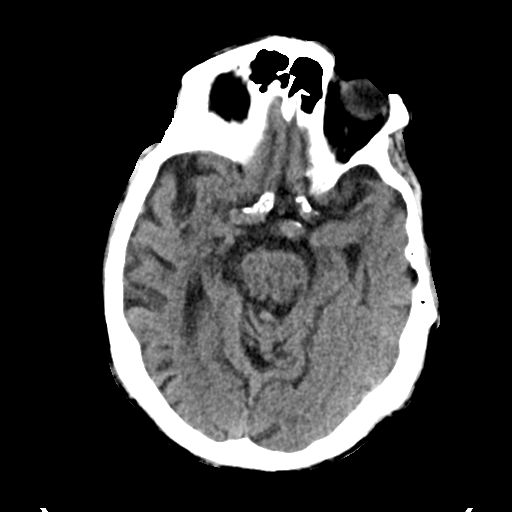
[im 16/34  brain]
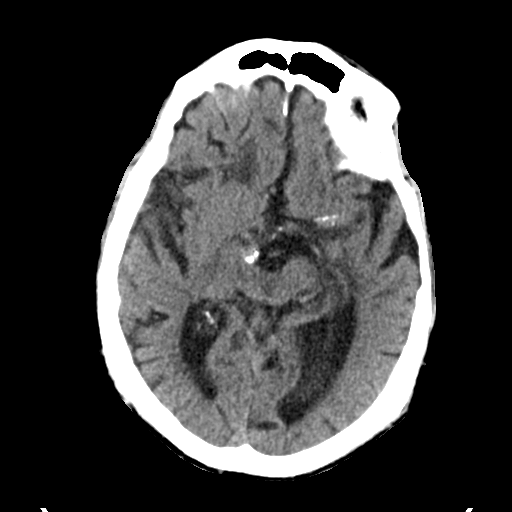
[im 18/34  brain]
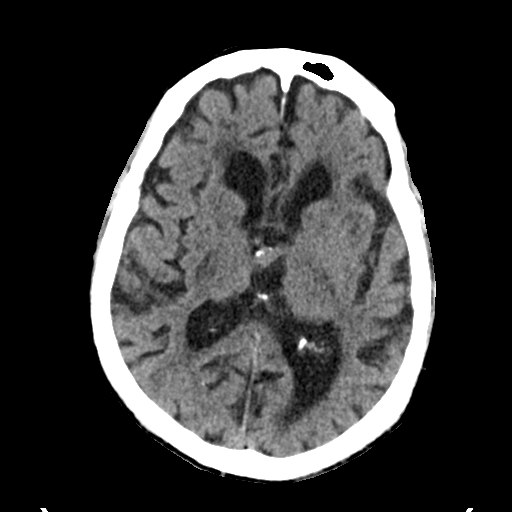
[im 18/34  bone]
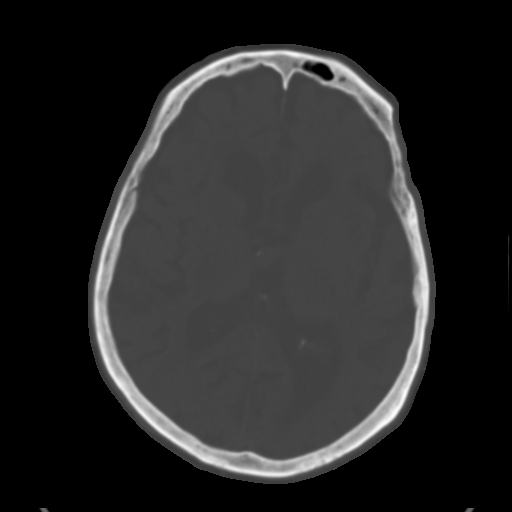
[im 20/34  brain]
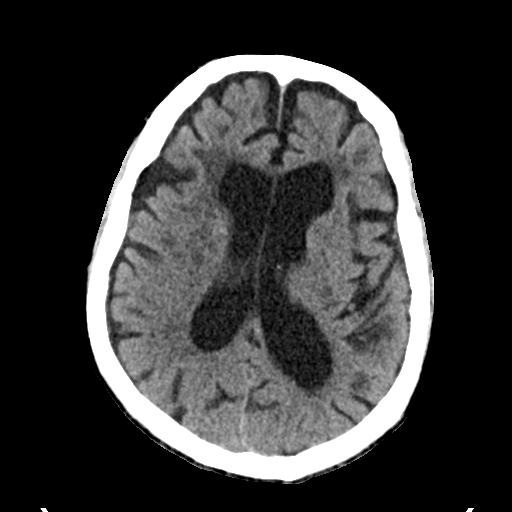
[im 22/34  brain]
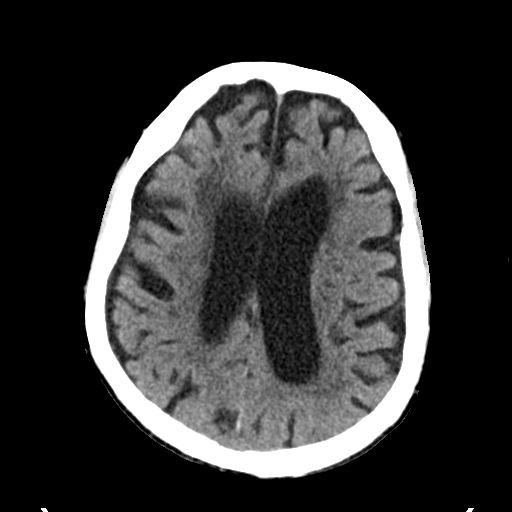
[im 24/34  brain]
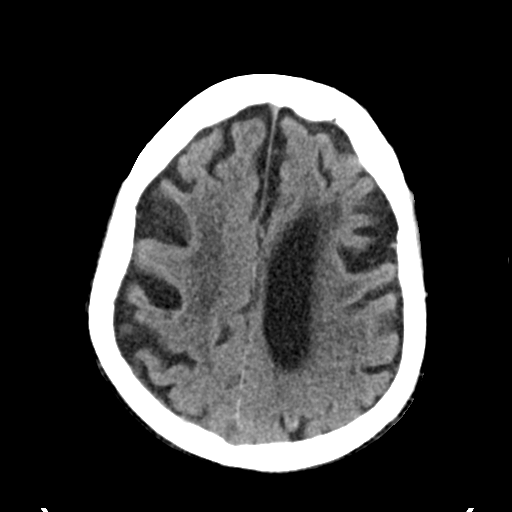
[im 26/34  brain]
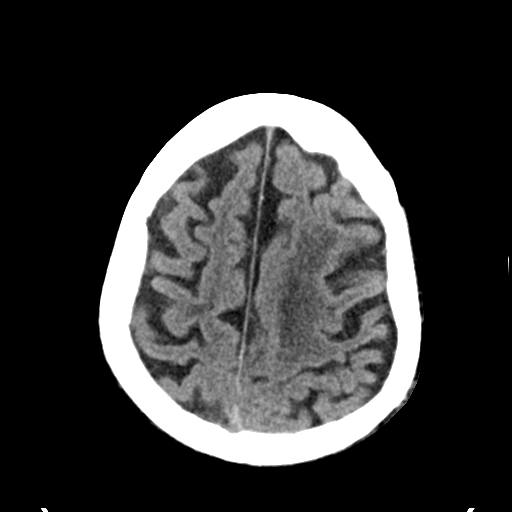
[im 26/34  bone]
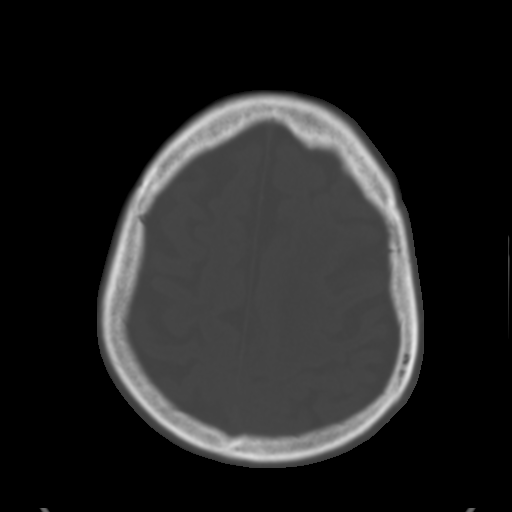
[im 28/34  brain]
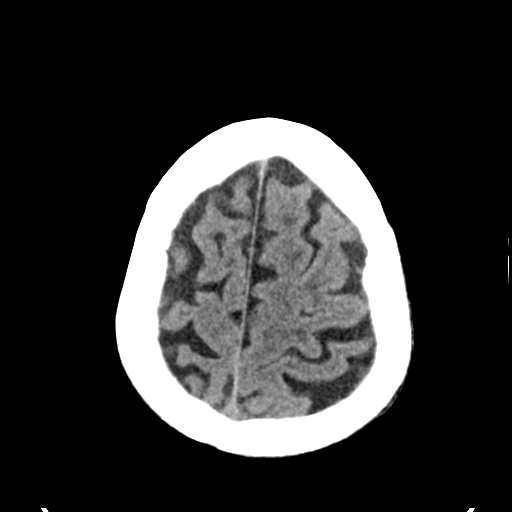
[im 30/34  brain]
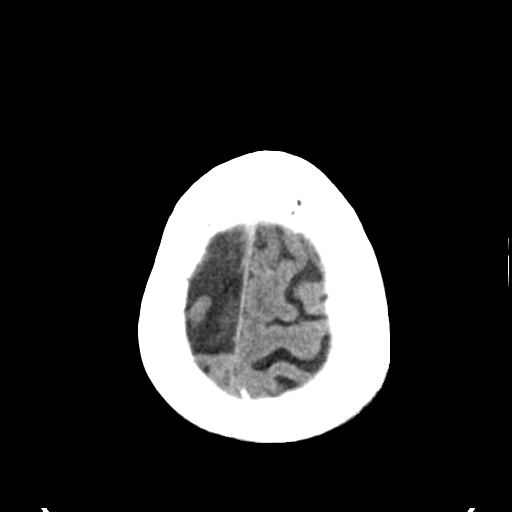
[im 32/34  brain]
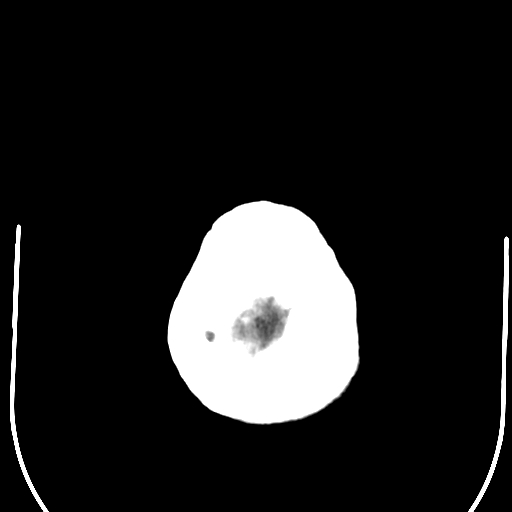

[16 of 30 positions shown; findings below may reference images not displayed]

FINDINGS: Examination demonstrates mild prominence of the ventricles, cisterns
and CSF spaces unchanged and likely age related atrophy. There is
chronic ischemic microvascular disease. There is no mass, mass
effect, shift of midline structures or acute hemorrhage. There is no
evidence to suggest acute infarction. There is stable prominence and
moderate calcification involving the basilar artery. Moderate
calcification over the proximal middle cerebral arteries. Remaining
bones soft tissues are unremarkable.
IMPRESSION: No acute intracranial findings.

Age related atrophic change and chronic ischemic microvascular
vascular disease.

## 2015-02-04 ENCOUNTER — Emergency Department (HOSPITAL_COMMUNITY)
Admission: EM | Admit: 2015-02-04 | Discharge: 2015-02-05 | Disposition: A | Discharge status: Home / Self Care | Payer: Medicare Other | Attending: Emergency Medicine | Admitting: Emergency Medicine

## 2015-02-04 ENCOUNTER — Emergency Department (HOSPITAL_COMMUNITY): Payer: Medicare Other

## 2015-02-04 ENCOUNTER — Encounter (HOSPITAL_COMMUNITY): Payer: Self-pay | Admitting: Emergency Medicine

## 2015-02-04 DIAGNOSIS — Y92129 Unspecified place in nursing home as the place of occurrence of the external cause: Secondary | ICD-10-CM | POA: Insufficient documentation

## 2015-02-04 DIAGNOSIS — I252 Old myocardial infarction: Secondary | ICD-10-CM | POA: Diagnosis not present

## 2015-02-04 DIAGNOSIS — W050XXA Fall from non-moving wheelchair, initial encounter: Secondary | ICD-10-CM | POA: Insufficient documentation

## 2015-02-04 DIAGNOSIS — Y998 Other external cause status: Secondary | ICD-10-CM | POA: Diagnosis not present

## 2015-02-04 DIAGNOSIS — Z86718 Personal history of other venous thrombosis and embolism: Secondary | ICD-10-CM | POA: Diagnosis not present

## 2015-02-04 DIAGNOSIS — Z8711 Personal history of peptic ulcer disease: Secondary | ICD-10-CM | POA: Diagnosis not present

## 2015-02-04 DIAGNOSIS — Z86711 Personal history of pulmonary embolism: Secondary | ICD-10-CM | POA: Insufficient documentation

## 2015-02-04 DIAGNOSIS — Y9389 Activity, other specified: Secondary | ICD-10-CM | POA: Insufficient documentation

## 2015-02-04 DIAGNOSIS — Z8673 Personal history of transient ischemic attack (TIA), and cerebral infarction without residual deficits: Secondary | ICD-10-CM | POA: Insufficient documentation

## 2015-02-04 DIAGNOSIS — Z8639 Personal history of other endocrine, nutritional and metabolic disease: Secondary | ICD-10-CM | POA: Diagnosis not present

## 2015-02-04 DIAGNOSIS — Z79899 Other long term (current) drug therapy: Secondary | ICD-10-CM | POA: Insufficient documentation

## 2015-02-04 DIAGNOSIS — S0990XA Unspecified injury of head, initial encounter: Secondary | ICD-10-CM | POA: Diagnosis present

## 2015-02-04 DIAGNOSIS — D649 Anemia, unspecified: Secondary | ICD-10-CM | POA: Diagnosis not present

## 2015-02-04 DIAGNOSIS — S0093XA Contusion of unspecified part of head, initial encounter: Secondary | ICD-10-CM

## 2015-02-04 DIAGNOSIS — S0181XA Laceration without foreign body of other part of head, initial encounter: Secondary | ICD-10-CM | POA: Insufficient documentation

## 2015-02-04 DIAGNOSIS — I10 Essential (primary) hypertension: Secondary | ICD-10-CM | POA: Insufficient documentation

## 2015-02-04 DIAGNOSIS — W19XXXA Unspecified fall, initial encounter: Secondary | ICD-10-CM

## 2015-02-04 MED ORDER — ACETAMINOPHEN 325 MG PO TABS
650.0000 mg | ORAL_TABLET | Freq: Once | ORAL | Status: AC
  Administered 2015-02-04: 650 mg via ORAL
  Filled 2015-02-04: qty 2

## 2015-02-04 NOTE — ED Provider Notes (Signed)
CSN: [id number]     Arrival date & time 02/04/15  2002 History   First MD Initiated Contact with Patient 02/04/15 2127     Chief Complaint  Patient presents with  . Fall     (Consider location/radiation/quality/duration/timing/severity/associated sxs/prior Treatment) Patient is a 81 y.o. male presenting with fall. The history is provided by the patient. No language interpreter was used.  Fall This is a new problem. The current episode started today. The problem occurs constantly. The problem has been gradually worsening. Associated symptoms include headaches. Pertinent negatives include no abdominal pain, fever or vomiting. Nothing aggravates the symptoms. He has tried nothing for the symptoms. The treatment provided moderate relief.  Pt fell out of his wheelchair and hit his head.    Past Medical History  Diagnosis Date  . Myocardial infarction (HCC)   . Peptic ulcer disease   . Hyperlipidemia   . Hypertension   . Hx pulmonary embolism   . Anemia   . CHF (congestive heart failure) (HCC)   . Deep vein thrombosis (DVT) (HCC)   . Stroke (HCC)   . Hypotension     reaction to coreg   Past Surgical History  Procedure Laterality Date  . Appendectomy    . Cataract extraction    . Gastrectomy    . Nephrectomy      benign kidney tumor  . Transluminal angioplasty     Family History  Problem Relation Age of Onset  . Diabetes Mother   . Stroke Mother   . Leukemia Sister   . Cirrhosis Son     s/p liver transplant   Social History  Substance Use Topics  . Smoking status: Never Smoker   . Smokeless tobacco: None  . Alcohol Use: No    Review of Systems  Constitutional: Negative for fever.  Gastrointestinal: Negative for vomiting and abdominal pain.  Skin: Positive for wound.  Neurological: Positive for headaches.  All other systems reviewed and are negative.     Allergies  Morphine sulfate  Home Medications   Prior to Admission medications   Medication Sig Start  Date End Date Taking? Authorizing Provider  allopurinol (ZYLOPRIM) 300 MG tablet Take 300 mg by mouth daily with breakfast.    Yes Historical Provider, MD  ferrous sulfate 325 (65 FE) MG tablet Take 325 mg by mouth daily with breakfast.   Yes Historical Provider, MD  metoprolol tartrate (LOPRESSOR) 25 MG tablet Take 12.5 mg by mouth 2 (two) times daily.    Yes Historical Provider, MD  docusate sodium (COLACE) 100 MG capsule Take 1 capsule (100 mg total) by mouth 2 (two) times daily as needed for mild constipation (while taking pain medication). Patient not taking: Reported on 08/25/2014 01/19/14   Raeford RazorStephen Kohut, MD  sennosides-docusate sodium (SENOKOT-S) 8.6-50 MG tablet Take 1 tablet by mouth every 12 (twelve) hours as needed for constipation.    Historical Provider, MD   BP 167/92 mmHg  Pulse 95  Temp(Src) 99.1 F (37.3 C) (Oral)  Resp 15  SpO2 99% Physical Exam  Constitutional: He is oriented to person, place, and time. He appears well-developed and well-nourished.  HENT:  Head: Normocephalic.  Nose: Nose normal.  Mouth/Throat: Oropharynx is clear and moist.  Eyes: Conjunctivae and EOM are normal. Pupils are equal, round, and reactive to light.  Neck: Normal range of motion.  Cardiovascular: Normal rate and regular rhythm.   Pulmonary/Chest: Effort normal.  Abdominal: He exhibits no distension.  Musculoskeletal: Normal range of motion.  Neurological:  He is alert and oriented to person, place, and time.  Skin:  Laceration to nose and forehead.  Pt has thin skin,   Psychiatric: He has a normal mood and affect.  Nursing note and vitals reviewed.   ED Course  Procedures (including critical care time) Labs Review Labs Reviewed - No data to display  Imaging Review Ct Head Wo Contrast  02/04/2015  CLINICAL DATA:  Witnessed fall from a wheelchair. EXAM: CT HEAD WITHOUT CONTRAST CT CERVICAL SPINE WITHOUT CONTRAST TECHNIQUE: Multidetector CT imaging of the head and cervical spine was  performed following the standard protocol without intravenous contrast. Multiplanar CT image reconstructions of the cervical spine were also generated. COMPARISON:  01/19/2014 FINDINGS: CT HEAD FINDINGS There is no intracranial hemorrhage, mass or evidence of acute infarction. There is moderately severe generalized atrophy. There is moderate chronic microvascular ischemic change. There is no significant extra-axial fluid collection. No acute intracranial findings are evident. The calvarium and skullbase are intact. CT CERVICAL SPINE FINDINGS The vertebral column, pedicles and facet articulations are intact. There is no evidence of acute fracture. No acute soft tissue abnormalities are evident. Mild facet arthritis is present throughout the cervical spine. No bone lesion or bony destruction. IMPRESSION: 1. Negative for acute intracranial traumatic injury. There is moderately severe generalized atrophy and chronic microvascular disease. 2. Negative for acute cervical spine fracture. Electronically Signed   By: Ellery Plunk M.D.   On: 02/04/2015 22:11   Ct Cervical Spine Wo Contrast  02/04/2015  CLINICAL DATA:  Witnessed fall from a wheelchair. EXAM: CT HEAD WITHOUT CONTRAST CT CERVICAL SPINE WITHOUT CONTRAST TECHNIQUE: Multidetector CT imaging of the head and cervical spine was performed following the standard protocol without intravenous contrast. Multiplanar CT image reconstructions of the cervical spine were also generated. COMPARISON:  01/19/2014 FINDINGS: CT HEAD FINDINGS There is no intracranial hemorrhage, mass or evidence of acute infarction. There is moderately severe generalized atrophy. There is moderate chronic microvascular ischemic change. There is no significant extra-axial fluid collection. No acute intracranial findings are evident. The calvarium and skullbase are intact. CT CERVICAL SPINE FINDINGS The vertebral column, pedicles and facet articulations are intact. There is no evidence of acute  fracture. No acute soft tissue abnormalities are evident. Mild facet arthritis is present throughout the cervical spine. No bone lesion or bony destruction. IMPRESSION: 1. Negative for acute intracranial traumatic injury. There is moderately severe generalized atrophy and chronic microvascular disease. 2. Negative for acute cervical spine fracture. Electronically Signed   By: Ellery Plunk M.D.   On: 02/04/2015 22:11   I have personally reviewed and evaluated these images and lab results as part of my medical decision-making.   EKG Interpretation None      MDM  Ct head and neck show no acute.  injury steristrips to wounds.  I do not think sutures will be of benefit.  Pt given tylenol for pain   Final diagnoses:  Fall  Laceration of forehead, initial encounter  Contusion of head, initial encounter    An After Visit Summary was printed and given to the patient.    Elson Areas, PA-C 02/04/15 2233  Vanetta Mulders, MD 02/04/15 [phone]

## 2015-02-04 NOTE — Discharge Instructions (Signed)
Facial Laceration ° A facial laceration is a cut on the face. These injuries can be painful and cause bleeding. Lacerations usually heal quickly, but they need special care to reduce scarring. °DIAGNOSIS  °Your health care provider will take a medical history, ask for details about how the injury occurred, and examine the wound to determine how deep the cut is. °TREATMENT  °Some facial lacerations may not require closure. Others may not be able to be closed because of an increased risk of infection. The risk of infection and the chance for successful closure will depend on various factors, including the amount of time since the injury occurred. °The wound may be cleaned to help prevent infection. If closure is appropriate, pain medicines may be given if needed. Your health care provider will use stitches (sutures), wound glue (adhesive), or skin adhesive strips to repair the laceration. These tools bring the skin edges together to allow for faster healing and a better cosmetic outcome. If needed, you may also be given a tetanus shot. °HOME CARE INSTRUCTIONS °· Only take over-the-counter or prescription medicines as directed by your health care provider. °· Follow your health care provider's instructions for wound care. These instructions will vary depending on the technique used for closing the wound. °For Sutures: °· Keep the wound clean and dry.   °· If you were given a bandage (dressing), you should change it at least once a day. Also change the dressing if it becomes wet or dirty, or as directed by your health care provider.   °· Wash the wound with soap and water 2 times a day. Rinse the wound off with water to remove all soap. Pat the wound dry with a clean towel.   °· After cleaning, apply a thin layer of the antibiotic ointment recommended by your health care provider. This will help prevent infection and keep the dressing from sticking.   °· You may shower as usual after the first 24 hours. Do not soak the  wound in water until the sutures are removed.   °· Get your sutures removed as directed by your health care provider. With facial lacerations, sutures should usually be taken out after 4-5 days to avoid stitch marks.   °· Wait a few days after your sutures are removed before applying any makeup. °For Skin Adhesive Strips: °· Keep the wound clean and dry.   °· Do not get the skin adhesive strips wet. You may bathe carefully, using caution to keep the wound dry.   °· If the wound gets wet, pat it dry with a clean towel.   °· Skin adhesive strips will fall off on their own. You may trim the strips as the wound heals. Do not remove skin adhesive strips that are still stuck to the wound. They will fall off in time.   °For Wound Adhesive: °· You may briefly wet your wound in the shower or bath. Do not soak or scrub the wound. Do not swim. Avoid periods of heavy sweating until the skin adhesive has fallen off on its own. After showering or bathing, gently pat the wound dry with a clean towel.   °· Do not apply liquid medicine, cream medicine, ointment medicine, or makeup to your wound while the skin adhesive is in place. This may loosen the film before your wound is healed.   °· If a dressing is placed over the wound, be careful not to apply tape directly over the skin adhesive. This may cause the adhesive to be pulled off before the wound is healed.   °· Avoid   prolonged exposure to sunlight or tanning lamps while the skin adhesive is in place. °· The skin adhesive will usually remain in place for 5-10 days, then naturally fall off the skin. Do not pick at the adhesive film.   °After Healing: °Once the wound has healed, cover the wound with sunscreen during the day for 1 full year. This can help minimize scarring. Exposure to ultraviolet light in the first year will darken the scar. It can take 1-2 years for the scar to lose its redness and to heal completely.  °SEEK MEDICAL CARE IF: °· You have a fever. °SEEK IMMEDIATE  MEDICAL CARE IF: °· You have redness, pain, or swelling around the wound.   °· You see a yellowish-white fluid (pus) coming from the wound.   °  °This information is not intended to replace advice given to you by your health care provider. Make sure you discuss any questions you have with your health care provider. °  °Document Released: 04/28/2004 Document Revised: 04/11/2014 Document Reviewed: 11/01/2012 °Elsevier Interactive Patient Education ©2016 [employer] ° °Contusion °A contusion is a deep bruise. Contusions are the result of a blunt injury to tissues and muscle fibers under the skin. The injury causes bleeding under the skin. The skin overlying the contusion may turn blue, purple, or yellow. Minor injuries will give you a painless contusion, but more severe contusions may stay painful and swollen for a few weeks.  °CAUSES  °This condition is usually caused by a blow, trauma, or direct force to an area of the body. °SYMPTOMS  °Symptoms of this condition include: °· Swelling of the injured area. °· Pain and tenderness in the injured area. °· Discoloration. The area may have redness and then turn blue, purple, or yellow. °DIAGNOSIS  °This condition is diagnosed based on a physical exam and medical history. An X-ray, CT scan, or MRI may be needed to determine if there are any associated injuries, such as broken bones (fractures). °TREATMENT  °Specific treatment for this condition depends on what area of the body was injured. In general, the best treatment for a contusion is resting, icing, applying pressure to (compression), and elevating the injured area. This is often called the RICE strategy. Over-the-counter anti-inflammatory medicines may also be recommended for pain control.  °HOME CARE INSTRUCTIONS  °· Rest the injured area. °· If directed, apply ice to the injured area: °¨ Put ice in a plastic bag. °¨ Place a towel between your skin and the bag. °¨ Leave the ice on for 20 minutes, 2-3 times per  day. °· If directed, apply light compression to the injured area using an elastic bandage. Make sure the bandage is not wrapped too tightly. Remove and reapply the bandage as directed by your health care provider. °· If possible, raise (elevate) the injured area above the level of your heart while you are sitting or lying down. °· Take over-the-counter and prescription medicines only as told by your health care provider. °SEEK MEDICAL CARE IF: °· Your symptoms do not improve after several days of treatment. °· Your symptoms get worse. °· You have difficulty moving the injured area. °SEEK IMMEDIATE MEDICAL CARE IF:  °· You have severe pain. °· You have numbness in a hand or foot. °· Your hand or foot turns pale or cold. °  °This information is not intended to replace advice given to you by your health care provider. Make sure you discuss any questions you have with your health care provider. °  °Document Released: 12/29/2004 Document   Revised: 12/10/2014 Document Reviewed: 08/06/2014 °Elsevier Interactive Patient Education ©2016 [employer] ° °

## 2015-02-04 NOTE — ED Notes (Signed)
Patient transported to CT 

## 2015-02-04 NOTE — ED Notes (Addendum)
Pt. Wound cleaned up with saline on two 4 x 4. Pt. States,"it hurts to clean his wound, he can't stand the pain." RN,Aimee made aware.

## 2015-02-04 NOTE — ED Notes (Signed)
Per EMS, patient from [facility]. Patient experienced a witnessed fall from wheelchair. Patient was leaning over and fell out of the wheelchair face first. Patient with laceration to top of the head, back of the head, and bridge of the nose. Patient is in C collar. Patient c/o generalized pain, esp head and neck. Patient is alert to his baseline per facility staff. No blood thinners, no deformities or rotation to extremities.

## 2015-03-05 DEATH — deceased
